# Patient Record
Sex: Female | Born: 1935 | Race: White | Hispanic: No | Marital: Single | Smoking: Current every day smoker
Health system: Southern US, Community
[De-identification: ages and names within clinical notes are randomized; demographics above are authoritative.]

## PROBLEM LIST (undated history)

## (undated) DIAGNOSIS — Z789 Other specified health status: Secondary | ICD-10-CM

## (undated) HISTORY — PX: FACIAL FRACTURE SURGERY: SHX1570

## (undated) HISTORY — PX: PARTIAL HYSTERECTOMY: SHX80

---

## 2010-10-23 ENCOUNTER — Ambulatory Visit: Payer: Self-pay | Admitting: Family Medicine

## 2013-02-11 ENCOUNTER — Ambulatory Visit: Payer: Self-pay | Admitting: Physician Assistant

## 2013-02-16 ENCOUNTER — Ambulatory Visit: Payer: Self-pay | Admitting: Physician Assistant

## 2014-05-12 IMAGING — US ULTRASOUND LEFT BREAST
1 series · 13 of 13 positions shown · non-contrast
Comparison: none

REASON FOR EXAM: av lt parenchymal density
COMMENTS:

PROCEDURE:     US  - US BREAST LEFT  - February 16, 2013 [DATE]
RESULT:     Ultrasound of the retroareolar portion left breast reveals no
focal abnormality. Compression spot studies reveal no focal abnormality.

[Series 1: ultrasound left breast · 0.08mm/px · 13 of 13 slices shown]
[im 1/13]
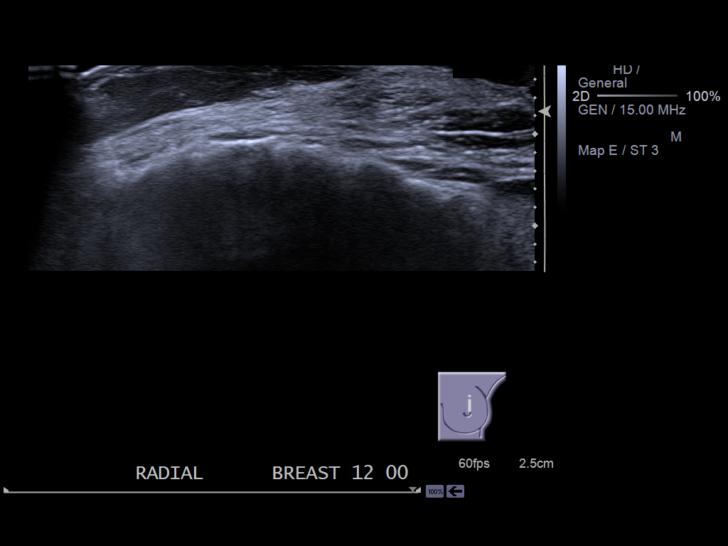
[im 2/13]
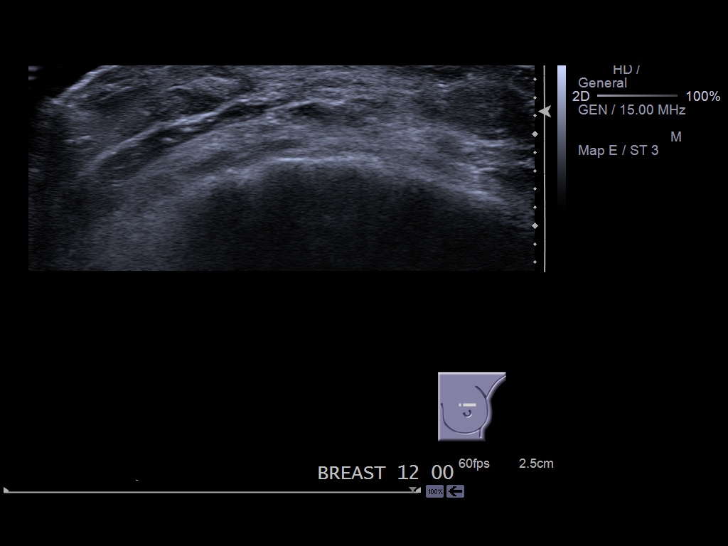
[im 3/13]
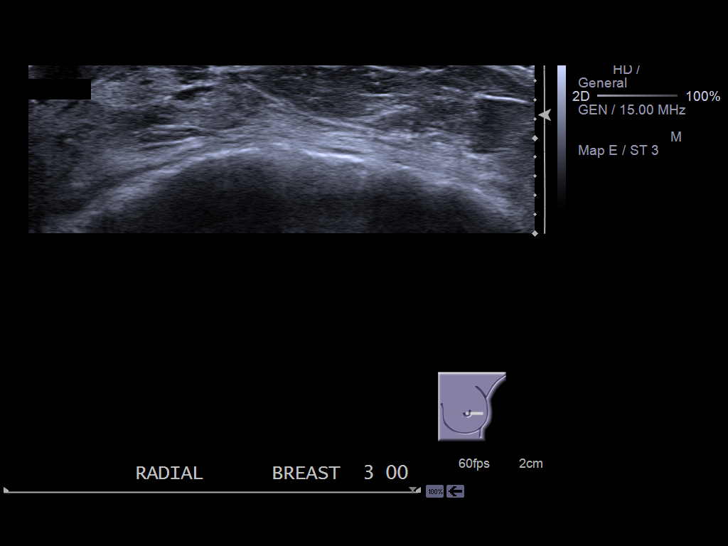
[im 4/13]
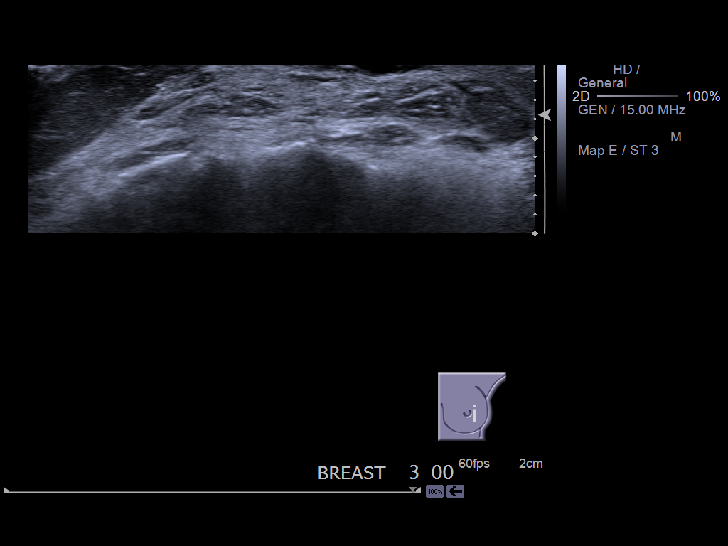
[im 5/13]
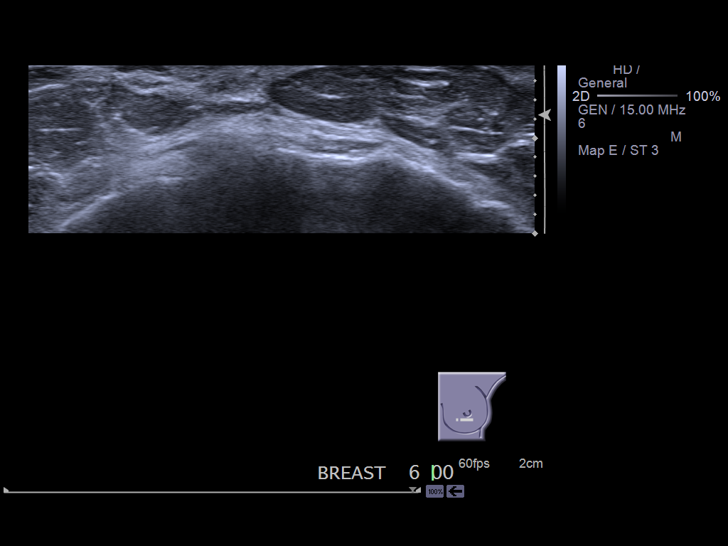
[im 6/13]
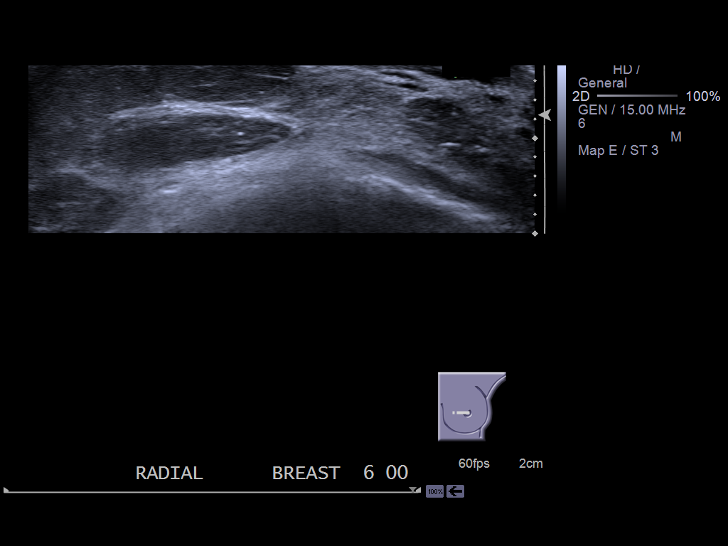
[im 7/13]
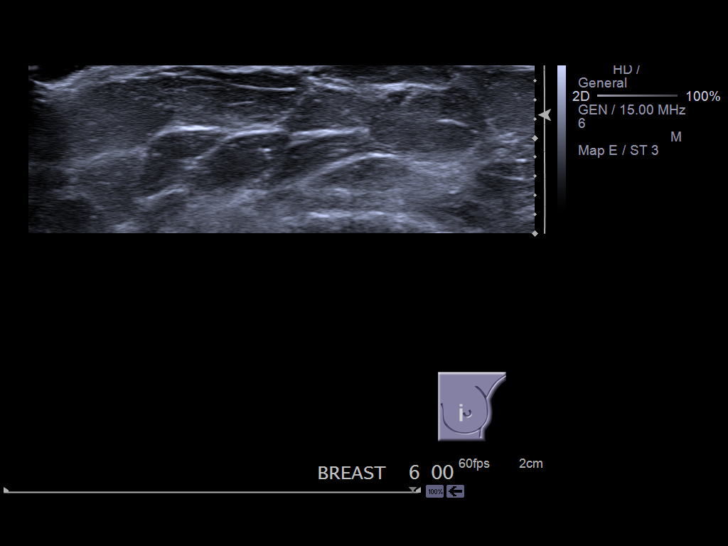
[im 8/13]
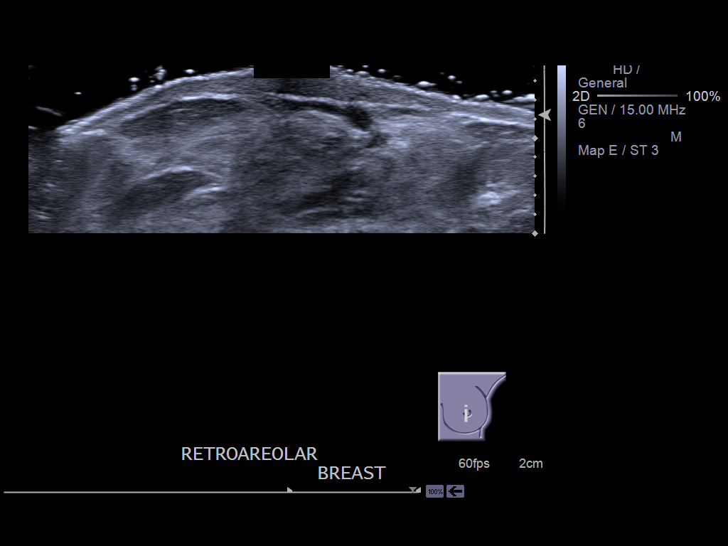
[im 9/13]
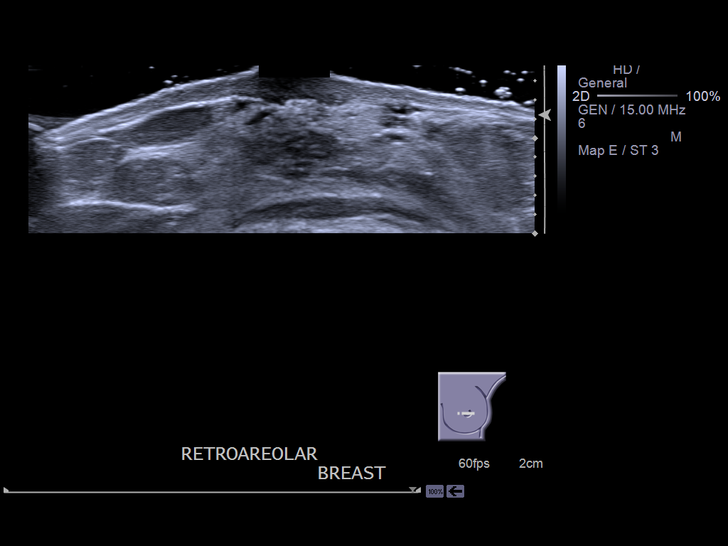
[im 10/13]
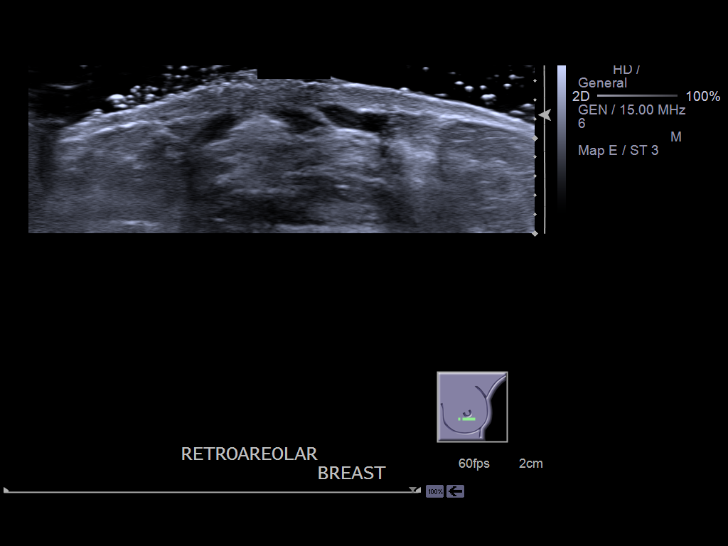
[im 11/13]
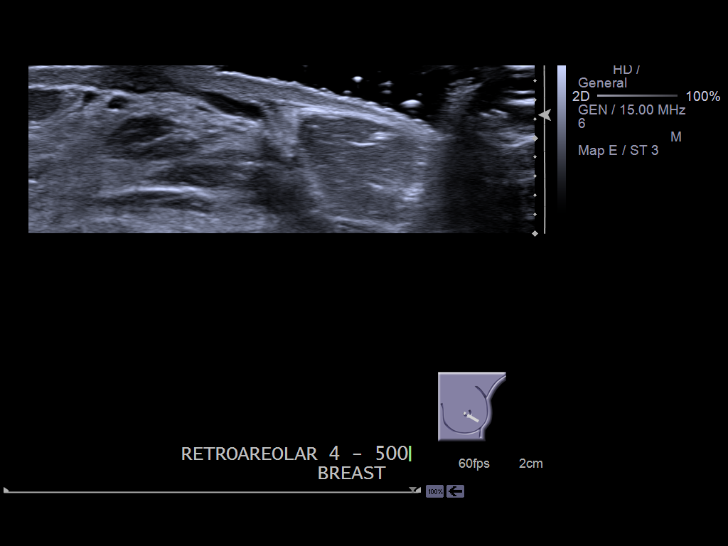
[im 12/13]
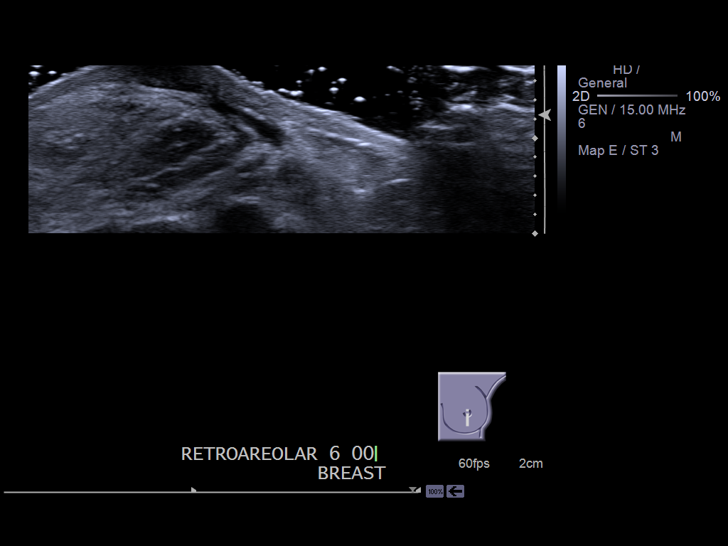
[im 13/13]
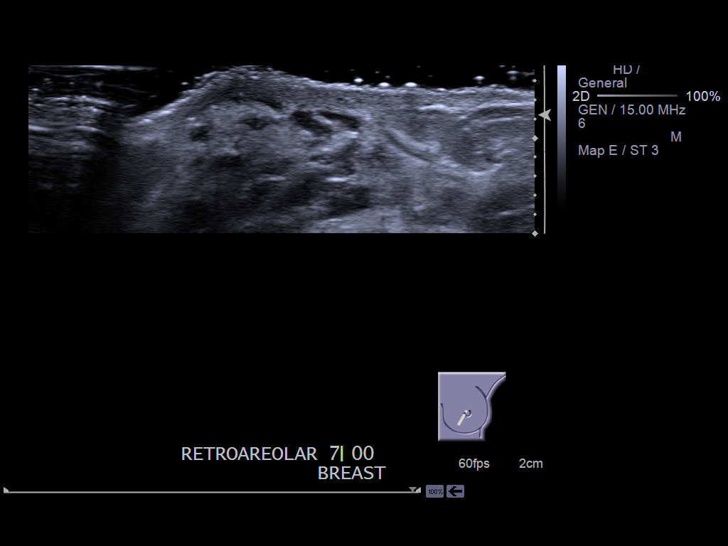

[13 of 13 positions shown; findings below may reference images not displayed]

IMPRESSION: Benign exam. BI-RADS: Category 2- Benign Finding

A NEGATIVE MAMMOGRAM REPORT DOES NOT PRECLUDE BIOPSY OR OTHER EVALUATION OF
A CLINICALLY PALPABLE OR OTHERWISE SUSPICIOUS MASS OR LESION. BREAST CANCER
MAY NOT BE DETECTED IN UP TO 10% OF CASES.

## 2015-03-23 ENCOUNTER — Ambulatory Visit
Admission: RE | Admit: 2015-03-23 | Discharge: 2015-03-23 | Disposition: A | Discharge status: Home / Self Care | Payer: Medicare Other | Source: Ambulatory Visit | Attending: Physician Assistant | Admitting: Physician Assistant

## 2015-03-23 ENCOUNTER — Other Ambulatory Visit: Payer: Self-pay | Admitting: Physician Assistant

## 2015-03-23 DIAGNOSIS — R2241 Localized swelling, mass and lump, right lower limb: Secondary | ICD-10-CM | POA: Diagnosis not present

## 2015-03-23 DIAGNOSIS — M7989 Other specified soft tissue disorders: Secondary | ICD-10-CM | POA: Insufficient documentation

## 2015-03-23 DIAGNOSIS — R609 Edema, unspecified: Secondary | ICD-10-CM

## 2017-02-04 ENCOUNTER — Encounter: Payer: Self-pay | Admitting: Emergency Medicine

## 2017-02-04 ENCOUNTER — Emergency Department
Admission: EM | Admit: 2017-02-04 | Discharge: 2017-02-04 | Disposition: A | Discharge status: Home / Self Care | Payer: Medicare Other | Attending: Emergency Medicine | Admitting: Emergency Medicine

## 2017-02-04 DIAGNOSIS — Z2914 Encounter for prophylactic rabies immune globin: Secondary | ICD-10-CM | POA: Diagnosis not present

## 2017-02-04 DIAGNOSIS — Z23 Encounter for immunization: Secondary | ICD-10-CM

## 2017-02-04 DIAGNOSIS — W5501XA Bitten by cat, initial encounter: Secondary | ICD-10-CM | POA: Insufficient documentation

## 2017-02-04 DIAGNOSIS — F17200 Nicotine dependence, unspecified, uncomplicated: Secondary | ICD-10-CM | POA: Diagnosis not present

## 2017-02-04 HISTORY — DX: Other specified health status: Z78.9

## 2017-02-04 MED ORDER — RABIES VACCINE, PCEC IM SUSR
1.0000 mL | Freq: Once | INTRAMUSCULAR | Status: AC
  Administered 2017-02-04: 1 mL via INTRAMUSCULAR
  Filled 2017-02-04: qty 1

## 2017-02-04 MED ORDER — RABIES IMMUNE GLOBULIN 150 UNIT/ML IM INJ
20.0000 [IU]/kg | INJECTION | Freq: Once | INTRAMUSCULAR | Status: AC
  Administered 2017-02-04: 1125 [IU] via INTRAMUSCULAR
  Filled 2017-02-04: qty 8

## 2017-02-04 NOTE — ED Notes (Signed)
Pt presents to ED, reports getting bit by stray cat on Sunday, reported to animal control and the cat was caught and taken to the pound, animal control however lost the cat. Pt presents with 3 healing wounds to outer L leg, noted to be somewhat red. Pt denies any pain, states she does not want any shots she does not need.

## 2017-02-04 NOTE — ED Provider Notes (Signed)
[facility] Emergency Department Provider Note   ____________________________________________   I have reviewed the triage vital signs and the nursing notes.   HISTORY  Chief Complaint Animal Bite    HPI Haley Bridges is a 81 y.o. female presents to the emergency department after being bitten by a stray cat 3 days ago. Patient reports animal control attempted to catch the cat however, they were not successful. Animal control recommended she come to the emergency department for rabies vaccination. Patient has cat bite wound along the left lateral leg, 3 puncture wounds with scabbing and erythema that appears to be healing. Patient reports she normally performs self treatment on past cat bite wounds. Patient asked questions about whether she should have the rabies vaccination regimen. I advised her since animal control could not obtain the status of the cat my best recommendation is that she follow through with the rabies vaccination course. Patient denies fever, chills, headache, vision changes, chest pain, chest tightness, shortness of breath, abdominal pain, nausea and vomiting.  Past Medical History:  Diagnosis Date  . Patient denies medical problems     There are no active problems to display for this patient.   Past Surgical History:  Procedure Laterality Date  . FACIAL FRACTURE SURGERY    . PARTIAL HYSTERECTOMY      Prior to Admission medications   Not on File    Allergies Other  History reviewed. No pertinent family history.  Social History Social History  Substance Use Topics  . Smoking status: Current Every Day Smoker  . Smokeless tobacco: Never Used  . Alcohol use No    Review of Systems Constitutional: Negative for fever/chills Eyes: No visual changes. ENT:  Negative for sore throat and for difficulty swallowing Cardiovascular: Denies chest pain. Respiratory: Denies cough. Denies shortness of breath. Gastrointestinal: No  abdominal pain.  No nausea, vomiting, diarrhea. Musculoskeletal: Negative for back pain. Skin: Negative for rash. Along the left lateral lower leg cat bite wound with with 3 puncture wounds.  Neurological: Negative for headaches.   ____________________________________________   PHYSICAL EXAM:  VITAL SIGNS: ED Triage Vitals [02/04/17 1334]  Enc Vitals Group     BP (!) 141/81     Pulse Rate (!) 105     Resp 20     Temp 98.4 F (36.9 C)     Temp Source Oral     SpO2 95 %     Weight 125 lb (56.7 kg)     Height 5\' 3"  (1.6 m)     Head Circumference      Peak Flow      Pain Score      Pain Loc      Pain Edu?      Excl. in GC?     Constitutional: Alert and oriented. Well appearing and in no acute distress.  Head: Normocephalic and atraumatic. Eyes: Conjunctivae are normal. PERRL.  Cardiovascular: Normal rate, regular rhythm. Normal distal pulses. Respiratory: Normal respiratory effort.  Gastrointestinal: Soft and nontender. No distention. Musculoskeletal: Nontender with normal range of motion in all extremities. Neurologic: Normal speech and language.  Skin:  Skin is warm, dry and intact. No rash noted. Along the left lateral lower leg cat bite wound: 3 puncture wounds with scabbing and erythema along the periwound. No drainage noted  Psychiatric: Mood and affect are normal.  ____________________________________________   LABS (all labs ordered are listed, but only abnormal results are displayed)  Labs Reviewed - No data to display  ____________________________________________  EKG None ____________________________________________  RADIOLOGY None ____________________________________________   PROCEDURES  Procedure(s) performed: No    Critical Care performed: no ____________________________________________   INITIAL IMPRESSION / ASSESSMENT AND PLAN / ED COURSE  Pertinent labs & imaging results that were available during my care of the patient were reviewed  by me and considered in my medical decision making (see chart for details).  Patient presented with cat bite wound along the left lateral leg, 3 puncture wounds covered with scabs and noted erythema along the periwound. History and physical exam are reassuring the wounds were healing and the patient was keeping the wound area clean and dry. Discussed with the patient recommended prophylactic antibiotic regimen for cat bite wounds. Patient did not feel she needed to take antibiotics and that she generally took care of wounds in the past and never had a problem in the past. Reviewed the risks of not taking an antibiotic and patient verbalized understanding as well as signed a refusal form. Patient did receive the initial rabies vaccination dosages during the course of care in emergency department. Patient provided educational material regarding the vaccines themselves as well as the next 4 doses and time frame. Patient informed of clinical course, understand medical decision-making process, and agree with plan.  Patient was advised to follow up with PCP or urgent care for future rabies vaccines or return to the emergency department and was also advised to return to the emergency department for symptoms that change or worsen.     ____________________________________________   FINAL CLINICAL IMPRESSION(S) / ED DIAGNOSES  Final diagnoses:  Cat bite, initial encounter  Need for rabies vaccination       NEW MEDICATIONS STARTED DURING THIS VISIT:  There are no discharge medications for this patient.    Note:  This document was prepared using Dragon voice recognition software and may include unintentional dictation errors.    Clois Comber, PA-C 02/04/17 1934    Rockne Menghini, MD 02/09/17 [phone]

## 2017-02-04 NOTE — Discharge Instructions (Signed)
Return to the emergency department or local urgent care for rabies vaccine on 02/07/2017, 02/11/2017, 02/18/2017. Continue to monitor Bite wound areas if you notice signs of infection please return to the emergency department.

## 2017-02-04 NOTE — ED Triage Notes (Signed)
Pt was bit by stray cat Sunday.  Animal control had cat per pt and lost it so they called and told her to come here. Got bit on left leg per pt. Denies pain to site.

## 2017-02-07 ENCOUNTER — Emergency Department
Admission: EM | Admit: 2017-02-07 | Discharge: 2017-02-07 | Disposition: A | Discharge status: Home / Self Care | Payer: Medicare Other | Attending: Emergency Medicine | Admitting: Emergency Medicine

## 2017-02-07 ENCOUNTER — Encounter: Payer: Self-pay | Admitting: Emergency Medicine

## 2017-02-07 DIAGNOSIS — F17200 Nicotine dependence, unspecified, uncomplicated: Secondary | ICD-10-CM | POA: Insufficient documentation

## 2017-02-07 DIAGNOSIS — Z23 Encounter for immunization: Secondary | ICD-10-CM | POA: Insufficient documentation

## 2017-02-07 DIAGNOSIS — Z203 Contact with and (suspected) exposure to rabies: Secondary | ICD-10-CM | POA: Diagnosis present

## 2017-02-07 MED ORDER — RABIES VACCINE, PCEC IM SUSR
1.0000 mL | Freq: Once | INTRAMUSCULAR | Status: AC
  Administered 2017-02-07: 1 mL via INTRAMUSCULAR
  Filled 2017-02-07: qty 1

## 2017-02-07 NOTE — ED Provider Notes (Signed)
[facility] Emergency Department Provider Note   ____________________________________________   First MD Initiated Contact with Patient 02/07/17 1424     (approximate)  I have reviewed the triage vital signs and the nursing notes.   HISTORY  Chief Complaint Rabies Injection    HPI Haley Bridges is a 81 y.o. female patient is here for second rabies series. Patient was bitten by a cat earlier this week. Animal control was unsuccessful entrapment. Patient states no reaction from taking injections of medications.   Past Medical History:  Diagnosis Date  . Patient denies medical problems     There are no active problems to display for this patient.   Past Surgical History:  Procedure Laterality Date  . FACIAL FRACTURE SURGERY    . PARTIAL HYSTERECTOMY      Prior to Admission medications   Not on File    Allergies Other  No family history on file.  Social History Social History  Substance Use Topics  . Smoking status: Current Every Day Smoker  . Smokeless tobacco: Never Used  . Alcohol use No    Review of Systems Constitutional: No fever/chills Eyes: No visual changes. ENT: No sore throat. Cardiovascular: Denies chest pain. Respiratory: Denies shortness of breath. Gastrointestinal: No abdominal pain.  No nausea, no vomiting.  No diarrhea.  No constipation. Genitourinary: Negative for dysuria. Musculoskeletal: Negative for back pain. Skin: Negative for rash. Neurological: Negative for headaches, focal weakness or numbness.   ____________________________________________   PHYSICAL EXAM:  VITAL SIGNS: ED Triage Vitals  Enc Vitals Group     BP 02/07/17 1417 (!) 141/100     Pulse Rate 02/07/17 1417 75     Resp 02/07/17 1417 18     Temp 02/07/17 1417 98.7 F (37.1 C)     Temp Source 02/07/17 1417 Oral     SpO2 02/07/17 1417 98 %     Weight 02/07/17 1417 125 lb (56.7 kg)     Height 02/07/17 1417 5\' 3"  (1.6 m)   Head Circumference --      Peak Flow --      Pain Score 02/07/17 1416 0     Pain Loc --      Pain Edu? --      Excl. in GC? --     Constitutional: Alert and oriented. Well appearing and in no acute distress. Cardiovascular: Normal rate, regular rhythm. Grossly normal heart sounds.  Good peripheral circulation. Respiratory: Normal respiratory effort.  No retractions. Lungs CTAB. Musculoskeletal: No lower extremity tenderness nor edema.  No joint effusions. Neurologic:  Normal speech and language. No gross focal neurologic deficits are appreciated. No gait instability. Skin:  Skin is warm, dry and intact. No rash noted.Healing puncture wounds left lateral leg Psychiatric: Mood and affect are normal. Speech and behavior are normal.  ____________________________________________   LABS (all labs ordered are listed, but only abnormal results are displayed)  Labs Reviewed - No data to display ____________________________________________  EKG   ____________________________________________  RADIOLOGY  No results found.  ____________________________________________   PROCEDURES  Procedure(s) performed: None  Procedures  Critical Care performed: No  ____________________________________________   INITIAL IMPRESSION / ASSESSMENT AND PLAN / ED COURSE  Pertinent labs & imaging results that were available during my care of the patient were reviewed by me and considered in my medical decision making (see chart for details).  Second rabies series secondary to cat bite to the left lower leg. Patient given discharge Instructions after receiving injections. Patient  advised to follow up per scheduled to complete series.      ____________________________________________   FINAL CLINICAL IMPRESSION(S) / ED DIAGNOSES  Final diagnoses:  Rabies exposure      NEW MEDICATIONS STARTED DURING THIS VISIT:  New Prescriptions   No medications on file     Note:  This document  was prepared using Dragon voice recognition software and may include unintentional dictation errors.    Joni ReiningSmith, Ronald K, PA-C 02/07/17 1432    Governor RooksLord, Rebecca, MD 02/07/17 [phone]

## 2017-02-07 NOTE — ED Triage Notes (Signed)
Here for 2nd in rabies series.

## 2017-02-11 ENCOUNTER — Encounter: Payer: Self-pay | Admitting: Emergency Medicine

## 2017-02-11 ENCOUNTER — Emergency Department
Admission: EM | Admit: 2017-02-11 | Discharge: 2017-02-11 | Disposition: A | Discharge status: Home / Self Care | Payer: Medicare Other | Attending: Student in an Organized Health Care Education/Training Program | Admitting: Student in an Organized Health Care Education/Training Program

## 2017-02-11 DIAGNOSIS — Z2914 Encounter for prophylactic rabies immune globin: Secondary | ICD-10-CM | POA: Diagnosis not present

## 2017-02-11 DIAGNOSIS — Z23 Encounter for immunization: Secondary | ICD-10-CM | POA: Insufficient documentation

## 2017-02-11 DIAGNOSIS — Z203 Contact with and (suspected) exposure to rabies: Secondary | ICD-10-CM | POA: Diagnosis not present

## 2017-02-11 DIAGNOSIS — F17200 Nicotine dependence, unspecified, uncomplicated: Secondary | ICD-10-CM | POA: Diagnosis not present

## 2017-02-11 MED ORDER — RABIES VACCINE, PCEC IM SUSR
1.0000 mL | Freq: Once | INTRAMUSCULAR | Status: AC
  Administered 2017-02-11: 1 mL via INTRAMUSCULAR
  Filled 2017-02-11: qty 1

## 2017-02-11 NOTE — ED Provider Notes (Signed)
[facility] Emergency Department Provider Note ____________________________________________  Time seen: 1606  I have reviewed the triage vital signs and the nursing notes.  HISTORY  Chief Complaint  Rabies Injection  HPI Haley Bridges is a 81 y.o. female presents to the ED for her 3rd of 4 rabies vaccine. She denies any interim complaints.   Past Medical History:  Diagnosis Date  . Patient denies medical problems     There are no active problems to display for this patient.   Past Surgical History:  Procedure Laterality Date  . FACIAL FRACTURE SURGERY    . PARTIAL HYSTERECTOMY      Prior to Admission medications   Not on File    Allergies Other  No family history on file.  Social History Social History  Substance Use Topics  . Smoking status: Current Every Day Smoker  . Smokeless tobacco: Never Used  . Alcohol use No    Review of Systems  Constitutional: Negative for fever. Cardiovascular: Negative for chest pain. Respiratory: Negative for shortness of breath. Gastrointestinal: Negative for abdominal pain, vomiting and diarrhea. Musculoskeletal: Negative for back pain. Skin: Negative for rash. Neurological: Negative for headaches, focal weakness or numbness. ____________________________________________  PHYSICAL EXAM:  VITAL SIGNS: ED Triage Vitals  Enc Vitals Group     BP 02/11/17 1558 (!) 137/50     Pulse Rate 02/11/17 1558 94     Resp 02/11/17 1558 16     Temp 02/11/17 1558 98.9 F (37.2 C)     Temp Source 02/11/17 1558 Oral     SpO2 02/11/17 1558 95 %     Weight 02/11/17 1557 125 lb (56.7 kg)     Height 02/11/17 1557 5\' 3"  (1.6 m)     Head Circumference --      Peak Flow --      Pain Score --      Pain Loc --      Pain Edu? --      Excl. in GC? --     Constitutional: Alert and oriented. Well appearing and in no distress. Head: Normocephalic and atraumatic. Cardiovascular: Normal rate, regular rhythm.  Normal distal pulses. Respiratory: Normal respiratory effort. No wheezes/rales/rhonchi. Musculoskeletal: Nontender with normal range of motion in all extremities.  Neurologic:  Normal gait without ataxia. Normal speech and language. No gross focal neurologic deficits are appreciated. Skin:  Skin is warm, dry and intact. No rash noted. Well-healing wounds of the left lateral calf. Three scabbed wounds with local erythema. No induration, warmth, streaking, or purulent drainage.  ____________________________________________  PROCEDURES  Rabies Vaccine 1 ml IM ____________________________________________  INITIAL IMPRESSION / ASSESSMENT AND PLAN / ED COURSE  Patient with ED administration of her 3rd of 4 rabies vaccines. She should return in 1 week for her 4th and final vaccine.  ____________________________________________  FINAL CLINICAL IMPRESSION(S) / ED DIAGNOSES  Final diagnoses:  Encounter for repeat administration of rabies vaccination      Lissa HoardMenshew, Betzabe Bevans V Bacon, PA-C 02/11/17 1627    Willy Eddyobinson, Patrick, MD 02/11/17 [phone]

## 2017-02-11 NOTE — ED Triage Notes (Signed)
Here for 3rd Rabies injection

## 2017-02-11 NOTE — Discharge Instructions (Signed)
Return to the ED next Wednesday, August 1st for your 4th and final rabies vaccine.

## 2017-02-18 ENCOUNTER — Emergency Department
Admission: EM | Admit: 2017-02-18 | Discharge: 2017-02-18 | Disposition: A | Discharge status: Home / Self Care | Payer: Medicare Other | Attending: Emergency Medicine | Admitting: Emergency Medicine

## 2017-02-18 ENCOUNTER — Encounter: Payer: Self-pay | Admitting: Emergency Medicine

## 2017-02-18 DIAGNOSIS — F17200 Nicotine dependence, unspecified, uncomplicated: Secondary | ICD-10-CM | POA: Insufficient documentation

## 2017-02-18 DIAGNOSIS — Z203 Contact with and (suspected) exposure to rabies: Secondary | ICD-10-CM | POA: Diagnosis not present

## 2017-02-18 DIAGNOSIS — Z23 Encounter for immunization: Secondary | ICD-10-CM | POA: Insufficient documentation

## 2017-02-18 MED ORDER — RABIES VACCINE, PCEC IM SUSR
1.0000 mL | Freq: Once | INTRAMUSCULAR | Status: AC
  Administered 2017-02-18: 1 mL via INTRAMUSCULAR
  Filled 2017-02-18: qty 1

## 2017-02-18 NOTE — ED Triage Notes (Signed)
Here for rabies injection.  

## 2017-02-19 NOTE — ED Provider Notes (Signed)
[facility] Emergency Department Provider Note  ____________________________________________  Time seen: Approximately 7:33 AM  I have reviewed the triage vital signs and the nursing notes.   HISTORY  Chief Complaint Rabies Injection    HPI Haley Bridges is a 81 y.o. female that presents to the emergency department for the 4th of 4 rabies vaccines. No interim complaints.   Past Medical History:  Diagnosis Date  . Patient denies medical problems     There are no active problems to display for this patient.   Past Surgical History:  Procedure Laterality Date  . FACIAL FRACTURE SURGERY    . PARTIAL HYSTERECTOMY      Prior to Admission medications   Not on File    Allergies Other  No family history on file.  Social History Social History  Substance Use Topics  . Smoking status: Current Every Day Smoker  . Smokeless tobacco: Never Used  . Alcohol use No     Review of Systems  Cardiovascular: No chest pain. Respiratory: No SOB. Gastrointestinal: No abdominal pain.  No nausea, no vomiting.  Musculoskeletal: Negative for musculoskeletal pain. Skin: Negative for rash, abrasions, lacerations, ecchymosis.   ____________________________________________   PHYSICAL EXAM:  VITAL SIGNS: ED Triage Vitals  Enc Vitals Group     BP 02/18/17 1456 (!) 155/97     Pulse Rate 02/18/17 1456 84     Resp 02/18/17 1456 16     Temp 02/18/17 1456 98.6 F (37 C)     Temp Source 02/18/17 1456 Oral     SpO2 02/18/17 1456 98 %     Weight 02/18/17 1457 125 lb (56.7 kg)     Height 02/18/17 1457 5\' 3"  (1.6 m)     Head Circumference --      Peak Flow --      Pain Score --      Pain Loc --      Pain Edu? --      Excl. in GC? --      Constitutional: Alert and oriented. Well appearing and in no acute distress. Eyes: Conjunctivae are normal. PERRL. EOMI. Head: Atraumatic. ENT:      Ears:      Nose: No congestion/rhinnorhea.  Mouth/Throat: Mucous membranes are moist.  Neck: No stridor.  Cardiovascular: Normal rate, regular rhythm.  Good peripheral circulation. Respiratory: Normal respiratory effort without tachypnea or retractions. Lungs CTAB. Good air entry to the bases with no decreased or absent breath sounds. Musculoskeletal: Full range of motion to all extremities. No gross deformities appreciated. Neurologic:  Normal speech and language. No gross focal neurologic deficits are appreciated.  Skin:  Skin is warm, dry. Well healing wounds to lateral left calf. No induration, warmth, streaking, purulent drainage.   ____________________________________________   LABS (all labs ordered are listed, but only abnormal results are displayed)  Labs Reviewed - No data to display ____________________________________________  EKG   ____________________________________________  RADIOLOGY  No results found.  ____________________________________________    PROCEDURES  Procedure(s) performed:    Procedures    Medications  rabies vaccine (RABAVERT) injection 1 mL (1 mL Intramuscular Given 02/18/17 1534)     ____________________________________________   INITIAL IMPRESSION / ASSESSMENT AND PLAN / ED COURSE  Pertinent labs & imaging results that were available during my care of the patient were reviewed by me and considered in my medical decision making (see chart for details).  Review of the [place] CSRS was performed in accordance of the [facility] prior to dispensing  any controlled drugs.   Presented to the ED for administration of her fourth of 4 rabies vaccines. Patient is given ED precautions to return to the ED for any worsening or new symptoms.     ____________________________________________  FINAL CLINICAL IMPRESSION(S) / ED DIAGNOSES  Final diagnoses:  Encounter for repeat administration of rabies vaccination      NEW MEDICATIONS STARTED DURING THIS VISIT:  There are no discharge  medications for this patient.       This chart was dictated using voice recognition software/Dragon. Despite best efforts to proofread, errors can occur which can change the meaning. Any change was purely unintentional.    Enid DerryWagner, Caitrin Pendergraph, PA-C 02/19/17 1530    Arnaldo NatalMalinda, Paul F, MD 02/19/17 [phone]

## 2017-05-12 ENCOUNTER — Encounter: Payer: Self-pay | Admitting: Emergency Medicine

## 2017-05-12 ENCOUNTER — Emergency Department: Payer: Medicare Other

## 2017-05-12 ENCOUNTER — Emergency Department
Admission: EM | Admit: 2017-05-12 | Discharge: 2017-05-12 | Disposition: A | Discharge status: Home / Self Care | Payer: Medicare Other | Attending: Emergency Medicine | Admitting: Emergency Medicine

## 2017-05-12 DIAGNOSIS — R1032 Left lower quadrant pain: Secondary | ICD-10-CM | POA: Diagnosis present

## 2017-05-12 DIAGNOSIS — F17210 Nicotine dependence, cigarettes, uncomplicated: Secondary | ICD-10-CM | POA: Insufficient documentation

## 2017-05-12 DIAGNOSIS — K5792 Diverticulitis of intestine, part unspecified, without perforation or abscess without bleeding: Secondary | ICD-10-CM | POA: Diagnosis not present

## 2017-05-12 LAB — COMPREHENSIVE METABOLIC PANEL
ALT: 14 U/L (ref 14–54)
AST: 24 U/L (ref 15–41)
Albumin: 3.9 g/dL (ref 3.5–5.0)
Alkaline Phosphatase: 95 U/L (ref 38–126)
Anion gap: 12 (ref 5–15)
BUN: 11 mg/dL (ref 6–20)
CHLORIDE: 101 mmol/L (ref 101–111)
CO2: 25 mmol/L (ref 22–32)
CREATININE: 0.95 mg/dL (ref 0.44–1.00)
Calcium: 9.4 mg/dL (ref 8.9–10.3)
GFR, EST NON AFRICAN AMERICAN: 55 mL/min — AB (ref >60)
Glucose, Bld: 106 mg/dL — ABNORMAL HIGH (ref 65–99)
Potassium: 3.7 mmol/L (ref 3.5–5.1)
Sodium: 138 mmol/L (ref 135–145)
Total Bilirubin: 1 mg/dL (ref 0.3–1.2)
Total Protein: 7.3 g/dL (ref 6.5–8.1)

## 2017-05-12 LAB — CBC
HCT: 41.7 % (ref 35.0–47.0)
Hemoglobin: 14 g/dL (ref 12.0–16.0)
MCH: 29.4 pg (ref 26.0–34.0)
MCHC: 33.5 g/dL (ref 32.0–36.0)
MCV: 87.7 fL (ref 80.0–100.0)
PLATELETS: 311 10*3/uL (ref 150–440)
RBC: 4.76 MIL/uL (ref 3.80–5.20)
RDW: 14.1 % (ref 11.5–14.5)
WBC: 12.6 10*3/uL — ABNORMAL HIGH (ref 3.6–11.0)

## 2017-05-12 LAB — URINALYSIS, COMPLETE (UACMP) WITH MICROSCOPIC
BILIRUBIN URINE: NEGATIVE
Bacteria, UA: NONE SEEN
Glucose, UA: NEGATIVE mg/dL
KETONES UR: 5 mg/dL — AB
Nitrite: NEGATIVE
Protein, ur: 30 mg/dL — AB
Specific Gravity, Urine: 1.021 (ref 1.005–1.030)
pH: 5 (ref 5.0–8.0)

## 2017-05-12 LAB — DIFFERENTIAL
BASOS ABS: 0.1 10*3/uL (ref 0–0.1)
BASOS PCT: 1 %
EOS ABS: 0 10*3/uL (ref 0–0.7)
Eosinophils Relative: 0 %
LYMPHS ABS: 1.7 10*3/uL (ref 1.0–3.6)
Lymphocytes Relative: 14 %
Monocytes Absolute: 0.7 10*3/uL (ref 0.2–0.9)
Monocytes Relative: 6 %
NEUTROS PCT: 79 %
Neutro Abs: 9.8 10*3/uL — ABNORMAL HIGH (ref 1.4–6.5)

## 2017-05-12 LAB — LIPASE, BLOOD: Lipase: 23 U/L (ref 11–51)

## 2017-05-12 MED ORDER — AMOXICILLIN-POT CLAVULANATE 875-125 MG PO TABS: 1.0000 | ORAL_TABLET | Freq: Three times a day (TID) | ORAL | 0 refills | Status: AC

## 2017-05-12 MED ORDER — HYDROCODONE-ACETAMINOPHEN 5-325 MG PO TABS: 1.0000 | ORAL_TABLET | Freq: Four times a day (QID) | ORAL | 0 refills | Status: DC | PRN

## 2017-05-12 MED ORDER — IOPAMIDOL (ISOVUE-300) INJECTION 61%
30.0000 mL | Freq: Once | INTRAVENOUS | Status: AC | PRN
  Administered 2017-05-12: 30 mL via ORAL

## 2017-05-12 MED ORDER — ONDANSETRON 4 MG PO TBDP: ORAL_TABLET | ORAL | 1 refills | Status: DC

## 2017-05-12 MED ORDER — IOPAMIDOL (ISOVUE-300) INJECTION 61%
75.0000 mL | Freq: Once | INTRAVENOUS | Status: AC | PRN
  Administered 2017-05-12: 75 mL via INTRAVENOUS

## 2017-05-12 MED ORDER — DOCUSATE SODIUM 100 MG PO CAPS: ORAL_CAPSULE | ORAL | 0 refills | Status: AC

## 2017-05-12 NOTE — Discharge Instructions (Signed)
We believe your symptoms are caused by diverticulitis.  Most of the time this condition (please read through the included information) can be cured with outpatient antibiotics.  Please take the full course of prescribed medication(s) and follow up with the doctors recommended above.  Return to the ED if your abdominal pain worsens or fails to improve, you develop bloody vomiting, bloody diarrhea, you are unable to tolerate fluids due to vomiting, fever greater than 101, or other symptoms that concern you.  Take Percocet as prescribed for severe pain. Do not drink alcohol, drive or participate in any other potentially dangerous activities while taking this medication as it may make you sleepy. Do not take this medication with any other sedating medications, either prescription or over-the-counter. If you were prescribed Percocet or Vicodin, do not take these with acetaminophen (Tylenol) as it is already contained within these medications.   This medication is an opiate (or narcotic) pain medication and can be habit forming.  Use it as little as possible to achieve adequate pain control.  Do not use or use it with extreme caution if you have a history of opiate abuse or dependence.  If you are on a pain contract with your primary care doctor or a pain specialist, be sure to let them know you were prescribed this medication today from the [facility].  This medication is intended for your use only - do not give any to anyone else and keep it in a secure place where nobody else, especially children, have access to it.  It will also cause or worsen constipation, so you may want to consider taking an over-the-counter stool softener while you are taking this medication.  

## 2017-05-12 NOTE — ED Provider Notes (Signed)
[facility] Emergency Department Provider Note  ____________________________________________   First MD Initiated Contact with Patient 05/12/17 1327     (approximate)  I have reviewed the triage vital signs and the nursing notes.   HISTORY  Chief Complaint Rectal Bleeding    HPI Haley Bridges is a 81 y.o. female Who is generally quite healthy for her age and does not take any regular medications.  she presents for evaluation of left lower quadrant abdominal pain and seeing some bright red blood in her stools for the last several days.  She was sent over from the urgent care for additional evaluation.  She reports that intermittently she has had mild to moderate sharp and stabbing pain in the left lower quadrant of her abdomen over the last 3-5 days.  Nothing in particular makes it better nor worse.  She noticed over about the same period of time that she has had some bright red blood in her stool.  She has never had an episode of our emergency resulted in only passing blood.  She does not describe it as dark or tarry.  She has had no generalized weakness nor fatigue.  She denies fever/chills, chest pain, shortness of breath, nausea, vomiting, and diarrhea.  He has not had similar symptoms in the past.   Past Medical History:  Diagnosis Date  . Patient denies medical problems     There are no active problems to display for this patient.   Past Surgical History:  Procedure Laterality Date  . FACIAL FRACTURE SURGERY    . PARTIAL HYSTERECTOMY      Prior to Admission medications   Medication Sig Start Date End Date Taking? Authorizing Provider  amoxicillin-clavulanate (AUGMENTIN) 875-125 MG tablet Take 1 tablet by mouth every 8 (eight) hours. 05/12/17 05/22/17  Loleta Rose, MD  docusate sodium (COLACE) 100 MG capsule Take 1 tablet once or twice daily as needed for constipation while taking narcotic pain medicine 05/12/17   Loleta Rose, MD    HYDROcodone-acetaminophen (NORCO/VICODIN) 5-325 MG tablet Take 1 tablet by mouth every 6 (six) hours as needed for severe pain. 05/12/17   Loleta Rose, MD  ondansetron (ZOFRAN ODT) 4 MG disintegrating tablet Allow 1-2 tablets to dissolve in your mouth every 8 hours as needed for nausea/vomiting 05/12/17   Loleta Rose, MD    Allergies Patient has no known allergies.  No family history on file.  Social History Social History  Substance Use Topics  . Smoking status: Current Every Day Smoker    Packs/day: 1.00    Types: Cigarettes  . Smokeless tobacco: Never Used  . Alcohol use No    Review of Systems Constitutional: No fever/chills. no generalized weakness, no fatigue Eyes: No visual changes. ENT: No sore throat. Cardiovascular: Denies chest pain. Respiratory: Denies shortness of breath. Gastrointestinal: intermittent sharp stabbing left lower quadrant abdominal pain over the last 3-5 days with some bright red blood mixed with her stool.  Denies nausea and vomiting. Genitourinary: Negative for dysuria. Musculoskeletal: Negative for neck pain.  Negative for back pain. Integumentary: Negative for rash. Neurological: Negative for headaches, focal weakness or numbness.   ____________________________________________   PHYSICAL EXAM:  VITAL SIGNS: ED Triage Vitals [05/12/17 1212]  Enc Vitals Group     BP (!) 153/62     Pulse Rate 97     Resp 17     Temp 98.4 F (36.9 C)     Temp Source Oral     SpO2 97 %  Weight 56.7 kg (125 lb)     Height 1.6 m (5\' 3" )     Head Circumference      Peak Flow      Pain Score 3     Pain Loc      Pain Edu?      Excl. in [place]?     Constitutional: Alert and oriented. Well appearing and in no acute distress. Eyes: Conjunctivae are normal.  Head: Atraumatic. Nose: No congestion/rhinnorhea. Mouth/Throat: Mucous membranes are moist. Neck: No stridor.  No meningeal signs.   Cardiovascular: Normal rate, regular rhythm. Good peripheral  circulation. Grossly normal heart sounds. Respiratory: Normal respiratory effort.  No retractions. Lungs CTAB. Gastrointestinal: Soft and nontender. No distention.  Genitourinary/Rectal:  very minimal stool was present in the rectal vault on digital exam, only a small amount of light brown stool.  Hemoccult was positive.  Quality control passed.  ED chaperone present throughout exam.   Musculoskeletal: No lower extremity tenderness nor edema. No gross deformities of extremities. Neurologic:  Normal speech and language. No gross focal neurologic deficits are appreciated.  Skin:  Skin is warm, dry and intact. No rash noted. Psychiatric: Mood and affect are normal. Speech and behavior are normal.  ____________________________________________   LABS (all labs ordered are listed, but only abnormal results are displayed)  Labs Reviewed  COMPREHENSIVE METABOLIC PANEL - Abnormal; Notable for the following:       Result Value   Glucose, Bld 106 (*)    GFR calc non Af Amer 55 (*)    All other components within normal limits  CBC - Abnormal; Notable for the following:    WBC 12.6 (*)    All other components within normal limits  URINALYSIS, COMPLETE (UACMP) WITH MICROSCOPIC - Abnormal; Notable for the following:    Color, Urine AMBER (*)    APPearance HAZY (*)    Hgb urine dipstick MODERATE (*)    Ketones, ur 5 (*)    Protein, ur 30 (*)    Leukocytes, UA SMALL (*)    Squamous Epithelial / LPF 0-5 (*)    All other components within normal limits  DIFFERENTIAL - Abnormal; Notable for the following:    Neutro Abs 9.8 (*)    All other components within normal limits  LIPASE, BLOOD   ____________________________________________  EKG  None - EKG not ordered by ED physician ____________________________________________  RADIOLOGY   Ct Abdomen Pelvis W Contrast  Result Date: 05/12/2017 CLINICAL DATA:  Abdominal pain.  Diverticulitis suspected. EXAM: CT ABDOMEN AND PELVIS WITH CONTRAST  TECHNIQUE: Multidetector CT imaging of the abdomen and pelvis was performed using the standard protocol following bolus administration of intravenous contrast. CONTRAST:  75mL ISOVUE-300 IOPAMIDOL (ISOVUE-300) INJECTION 61% COMPARISON:  None FINDINGS: Lower chest: No pleural effusion.  No acute abnormality. Hepatobiliary: Scattered liver cysts identified. The gallbladder appears normal. No biliary dilatation. Pancreas: There are few calcifications identified within the head of pancreas. No pancreatic inflammation. No pancreatic duct dilatation or mass. Spleen: The spleen appears normal. Adrenals/Urinary Tract: The adrenal glands are unremarkable. No kidney mass or hydronephrosis identified. 5 mm hypodense structure in the right kidney is too small to characterize. Urinary bladder is normal. Stomach/Bowel: Small hiatal hernia. The small bowel loops have a normal course and caliber. There is no bowel obstruction. Normal appearance of the proximal colon. Numerous distal colonic diverticula identified. There is a segment of wall thickening and inflammation of the sigmoid colon measuring approximately 8 cm, image 59 of series 2. Findings  compatible with acute diverticulitis. No abscess or evidence of free perforation. No free fluid or identified. Vascular/Lymphatic: Aortic atherosclerosis. No aneurysm. No enlarged upper abdominal lymph nodes. There is no pelvic or inguinal adenopathy. Reproductive: The uterus and adnexal structures are unremarkable. Other: No free fluid or fluid collections. Musculoskeletal: Degenerative disc disease is identified throughout the lower thoracic and lumbar spine. IMPRESSION: 1. Examination is positive for acute sigmoid diverticulitis. No complications identified. Specifically there is no evidence for perforation or abscess. 2.  Aortic Atherosclerosis (ICD10-I70.0). 3. Liver cysts. 4. Thoracic and lumbar degenerative disc disease. Electronically Signed   By: Signa Kellaylor  Stroud M.D.   On:  05/12/2017 15:29    ____________________________________________   PROCEDURES  Critical Care performed: No   Procedure(s) performed:   Procedures   ____________________________________________   INITIAL IMPRESSION / ASSESSMENT AND PLAN / ED COURSE  As part of my medical decision making, I reviewed the following data within the electronic MEDICAL RECORD NUMBER Nursing notes reviewed and incorporated, Labs reviewed  and Notes from prior ED visits    differential diagnosis includes diverticular disease, AV malformation, infectious, hemorrhoids, etc.  However based on the fact she is having left lower quadrant pain in addition to the bright red blood, I am most suspicious for diverticular disease.  She has no other signs or symptoms of infection, but her lab work does demonstrate a mild leukocytosis of 12.6.  Her metabolic panel is reassuring and her LFTs are normal.  Also reassuring is her hemoglobin of 14.0.  She does have heme positive stool but no melena or gross blood.  The patient is adamant that she does not want stay in the hospital unless it is absolutely necessary.  I will evaluate with the CT scan of her abdomen and pelvis with oral and IV contrast.  We will then determine the need for outpatient versus inpatient follow-up.  Clinical Course as of May 12 2014  Tue May 12, 2017  1605 Uncomplicated diverticulitis, no evidence of abscess/perforation.  Discussed with patient, she agrees with the plan for outpatient antibiotics and follow-up.    I gave my usual and customary return precautions.  She understands. CT Abdomen Pelvis W Contrast [CF]  1607 because the pharmacy is still open, I will discharge her to go directly to the pharmacy rather than give her a first dose in the emergency department  [CF]    Clinical Course User Index [CF] Loleta RoseForbach, Iaan Oregel, MD    ____________________________________________  FINAL CLINICAL IMPRESSION(S) / ED DIAGNOSES  Final diagnoses:    Diverticulitis     MEDICATIONS GIVEN DURING THIS VISIT:  Medications  iopamidol (ISOVUE-300) 61 % injection 30 mL (30 mLs Oral Contrast Given 05/12/17 1354)  iopamidol (ISOVUE-300) 61 % injection 75 mL (75 mLs Intravenous Contrast Given 05/12/17 1510)     NEW OUTPATIENT MEDICATIONS STARTED DURING THIS VISIT:  Discharge Medication List as of 05/12/2017  4:10 PM    START taking these medications   Details  amoxicillin-clavulanate (AUGMENTIN) 875-125 MG tablet Take 1 tablet by mouth every 8 (eight) hours., Starting Tue 05/12/2017, Until Fri 05/22/2017, Print    docusate sodium (COLACE) 100 MG capsule Take 1 tablet once or twice daily as needed for constipation while taking narcotic pain medicine, Print    HYDROcodone-acetaminophen (NORCO/VICODIN) 5-325 MG tablet Take 1 tablet by mouth every 6 (six) hours as needed for severe pain., Starting Tue 05/12/2017, Print    ondansetron (ZOFRAN ODT) 4 MG disintegrating tablet Allow 1-2 tablets to dissolve in your mouth  every 8 hours as needed for nausea/vomiting, Print        Discharge Medication List as of 05/12/2017  4:10 PM      Discharge Medication List as of 05/12/2017  4:10 PM       Note:  This document was prepared using Dragon voice recognition software and may include unintentional dictation errors.    Loleta Rose, MD 05/12/17 2015

## 2017-05-12 NOTE — ED Triage Notes (Signed)
Pt in via POV; sent over from [facility].  Pt reports noticing small amounts of bright red blood in stool w/ intermittent LLQ abdominal pain x 5 days.  Vitals WDL, NAD noted at this time.

## 2017-05-12 NOTE — ED Notes (Signed)
Pt reports with c/o blood in her stool x 3-4 days. States she was feeling "depleted" and then the blood showed up. She reports that it happens mostly when she uses the toilet, but sometimes a bit leaks out in her panties. She reports hemorrhoid hx (years ago); denies straining with bowel movements. Pt alert & oriented with NAD noted.

## 2019-12-02 ENCOUNTER — Encounter: Payer: Self-pay | Admitting: Emergency Medicine

## 2019-12-02 ENCOUNTER — Other Ambulatory Visit: Payer: Self-pay

## 2019-12-02 ENCOUNTER — Emergency Department
Admission: EM | Admit: 2019-12-02 | Discharge: 2019-12-02 | Disposition: A | Discharge status: Home / Self Care | Payer: Medicare Other | Attending: Emergency Medicine | Admitting: Emergency Medicine

## 2019-12-02 ENCOUNTER — Emergency Department: Payer: Medicare Other

## 2019-12-02 DIAGNOSIS — S7011XA Contusion of right thigh, initial encounter: Secondary | ICD-10-CM | POA: Diagnosis not present

## 2019-12-02 DIAGNOSIS — S7001XA Contusion of right hip, initial encounter: Secondary | ICD-10-CM | POA: Insufficient documentation

## 2019-12-02 DIAGNOSIS — Y92018 Other place in single-family (private) house as the place of occurrence of the external cause: Secondary | ICD-10-CM | POA: Diagnosis not present

## 2019-12-02 DIAGNOSIS — Y9301 Activity, walking, marching and hiking: Secondary | ICD-10-CM | POA: Diagnosis not present

## 2019-12-02 DIAGNOSIS — Y998 Other external cause status: Secondary | ICD-10-CM | POA: Insufficient documentation

## 2019-12-02 DIAGNOSIS — F17210 Nicotine dependence, cigarettes, uncomplicated: Secondary | ICD-10-CM | POA: Diagnosis not present

## 2019-12-02 DIAGNOSIS — Z7982 Long term (current) use of aspirin: Secondary | ICD-10-CM | POA: Diagnosis not present

## 2019-12-02 DIAGNOSIS — S79921A Unspecified injury of right thigh, initial encounter: Secondary | ICD-10-CM | POA: Diagnosis present

## 2019-12-02 DIAGNOSIS — W010XXA Fall on same level from slipping, tripping and stumbling without subsequent striking against object, initial encounter: Secondary | ICD-10-CM | POA: Diagnosis not present

## 2019-12-02 MED ORDER — LIDOCAINE 5 % EX PTCH: 1.0000 | MEDICATED_PATCH | Freq: Two times a day (BID) | CUTANEOUS | 0 refills | Status: AC

## 2019-12-02 MED ORDER — LIDOCAINE 5 % EX PTCH
1.0000 | MEDICATED_PATCH | CUTANEOUS | Status: DC
  Administered 2019-12-02: 1 via TRANSDERMAL
  Filled 2019-12-02: qty 1

## 2019-12-02 NOTE — Discharge Instructions (Addendum)
Follow discharge care instructions using heat instead of ice.  Changes Lidoderm patches every 12 hours.

## 2019-12-02 NOTE — ED Triage Notes (Signed)
Pt here for right leg and hip pain.  Last week stepped on cat and fell.  Initially didn't have pain but now c/o pain with walking.  Able to walk but hurts.  Denies any medical problems.

## 2019-12-02 NOTE — ED Notes (Signed)
See triage note  Presents s/p fall 1 week  States she landed on right hip/leg  States she did not have pain at first  Developed pain a few days later  Unable to bear wt

## 2019-12-02 NOTE — ED Provider Notes (Signed)
[facility] Emergency Department Provider Note   ____________________________________________   First MD Initiated Contact with Patient 12/02/19 1141     (approximate)  I have reviewed the triage vital signs and the nursing notes.   HISTORY  Chief Complaint Leg Pain    HPI Haley Bridges is a 84 y.o. female patient complain of right hip and leg pain secondary to a fall 1 week ago.  Patient states she tripped and fell after stepping on a cat.  Patient state increased pain with weightbearing and walking.  Patient rates the pain as a 6/10.  Patient described pain as "achy".  Reports no other complaints from the fall.  No palliative measure for complaint.      Past Medical History:  Diagnosis Date  . Patient denies medical problems     There are no problems to display for this patient.   Past Surgical History:  Procedure Laterality Date  . FACIAL FRACTURE SURGERY    . PARTIAL HYSTERECTOMY      Prior to Admission medications   Medication Sig Start Date End Date Taking? Authorizing Provider  aspirin 325 MG tablet Take 325 mg by mouth daily.   Yes [provider]  docusate sodium (COLACE) 100 MG capsule Take 1 tablet once or twice daily as needed for constipation while taking narcotic pain medicine 05/12/17   Hinda Kehr, MD  lidocaine (LIDODERM) 5 % Place 1 patch onto the skin every 12 (twelve) hours. Remove & Discard patch within 12 hours or as directed by MD 12/02/19 12/01/20  Sable Feil, PA-C    Allergies Patient has no known allergies.  History reviewed. No pertinent family history.  Social History Social History   Tobacco Use  . Smoking status: Current Every Day Smoker    Packs/day: 1.00    Types: Cigarettes  . Smokeless tobacco: Never Used  Substance Use Topics  . Alcohol use: No  . Drug use: No    Review of Systems Constitutional: No fever/chills Eyes: No visual changes. ENT: No sore throat. Cardiovascular:  Denies chest pain. Respiratory: Denies shortness of breath. Gastrointestinal: No abdominal pain.  No nausea, no vomiting.  No diarrhea.  No constipation. Genitourinary: Negative for dysuria. Musculoskeletal: Right lateral hip pain. Skin: Negative for rash. Neurological: Negative for headaches, focal weakness or numbness.   ____________________________________________   PHYSICAL EXAM:  VITAL SIGNS: ED Triage Vitals [12/02/19 1119]  Enc Vitals Group     BP (!) 145/68     Pulse Rate 88     Resp 16     Temp 98.2 F (36.8 C)     Temp Source Oral     SpO2 96 %     Weight 120 lb (54.4 kg)     Height 5\' 3"  (1.6 m)     Head Circumference      Peak Flow      Pain Score 6     Pain Loc      Pain Edu?      Excl. in [place]?     Constitutional: Alert and oriented. Well appearing and in no acute distress. Cardiovascular: Normal rate, regular rhythm. Grossly normal heart sounds.  Good peripheral circulation. Respiratory: Normal respiratory effort.  No retractions. Lungs CTAB. Gastrointestinal: Soft and nontender. No distention. No abdominal bruits. No CVA tenderness. Genitourinary: Deferred Musculoskeletal: No obvious deformity to the right hip.  No leg length discrepancy.  Patient is moderate guarding palpation to greater trochanter.  No lower extremity tenderness nor  edema.  No joint effusions. Neurologic:  Normal speech and language. No gross focal neurologic deficits are appreciated. No gait instability. Skin:  Skin is warm, dry and intact. No rash noted. Psychiatric: Mood and affect are normal. Speech and behavior are normal.  ____________________________________________   LABS (all labs ordered are listed, but only abnormal results are displayed)  Labs Reviewed - No data to display ____________________________________________  EKG   ____________________________________________  RADIOLOGY  ED MD interpretation:    Official radiology report(s): DG Hip Unilat W or Wo  Pelvis 2-3 Views Right  Result Date: 12/02/2019 CLINICAL DATA:  Right hip pain status post fall 1 week ago EXAM: DG HIP (WITH OR WITHOUT PELVIS) 2-3V RIGHT COMPARISON:  None. FINDINGS: There is no evidence of hip fracture or dislocation. There is generalized osteopenia. There is mild osteoarthritis of the right hip. There is no aggressive osseous lesion. IMPRESSION: No acute osseous injury of the right hip. Given the patient's age and osteopenia, if there is persistent clinical concern for an occult hip fracture, a MRI of the hip is recommended for increased sensitivity. Electronically Signed   By: Elige Ko   On: 12/02/2019 12:27    ____________________________________________   PROCEDURES  Procedure(s) performed (including Critical Care):  Procedures   ____________________________________________   INITIAL IMPRESSION / ASSESSMENT AND PLAN / ED COURSE  As part of my medical decision making, I reviewed the following data within the electronic MEDICAL RECORD NUMBER     Patient presents with right hip pain secondary to fall 1 week ago.  Discussed x-ray findings with patient with no acute findings.  Discussed sequela of hip contusion with age group for patient.  Advised to follow-up PCP in 1 week if no improvement or worsening complaint.  Patient given a prescription for Lidoderm patches in lieu of pain medications.    Haley Bridges was evaluated in Emergency Department on 12/02/2019 for the symptoms described in the history of present illness. She was evaluated in the context of the global COVID-19 pandemic, which necessitated consideration that the patient might be at risk for infection with the SARS-CoV-2 virus that causes COVID-19. Institutional protocols and algorithms that pertain to the evaluation of patients at risk for COVID-19 are in a state of rapid change based on information released by regulatory bodies including the CDC and federal and state organizations. These policies and  algorithms were followed during the patient's care in the ED.       ____________________________________________   FINAL CLINICAL IMPRESSION(S) / ED DIAGNOSES  Final diagnoses:  Contusion, hip and thigh, right, initial encounter     ED Discharge Orders         Ordered    lidocaine (LIDODERM) 5 %  Every 12 hours     12/02/19 1237           Note:  This document was prepared using Dragon voice recognition software and may include unintentional dictation errors.    Joni Reining, PA-C 12/02/19 1240    Emily Filbert, MD 12/02/19 [phone]

## 2019-12-02 NOTE — ED Notes (Signed)
To x-ray via stretcher.

## 2021-02-24 IMAGING — CR DG HIP (WITH OR WITHOUT PELVIS) 2-3V*R*
3 series · 3 of 3 positions shown · non-contrast
Comparison: None.

CLINICAL DATA: Right hip pain status post fall 1 week ago

EXAM:
DG HIP (WITH OR WITHOUT PELVIS) 2-3V RIGHT

[pelvis ap]
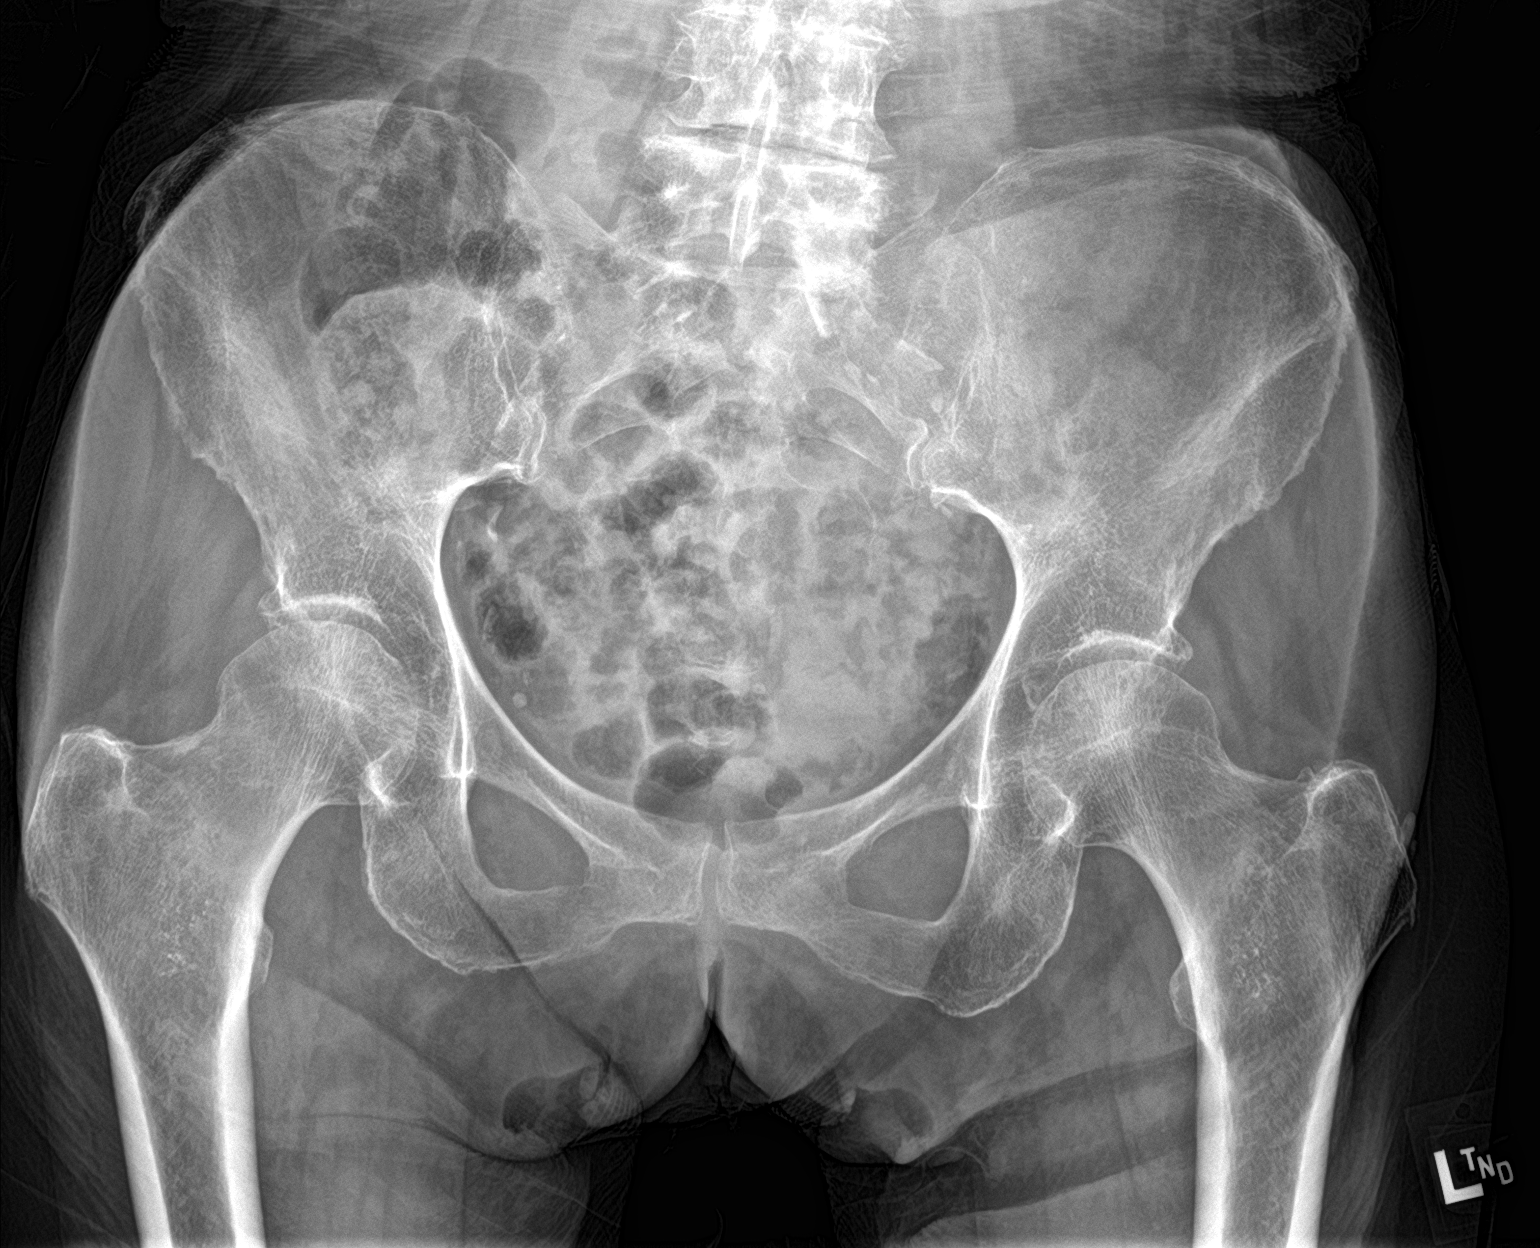

[hip ap]
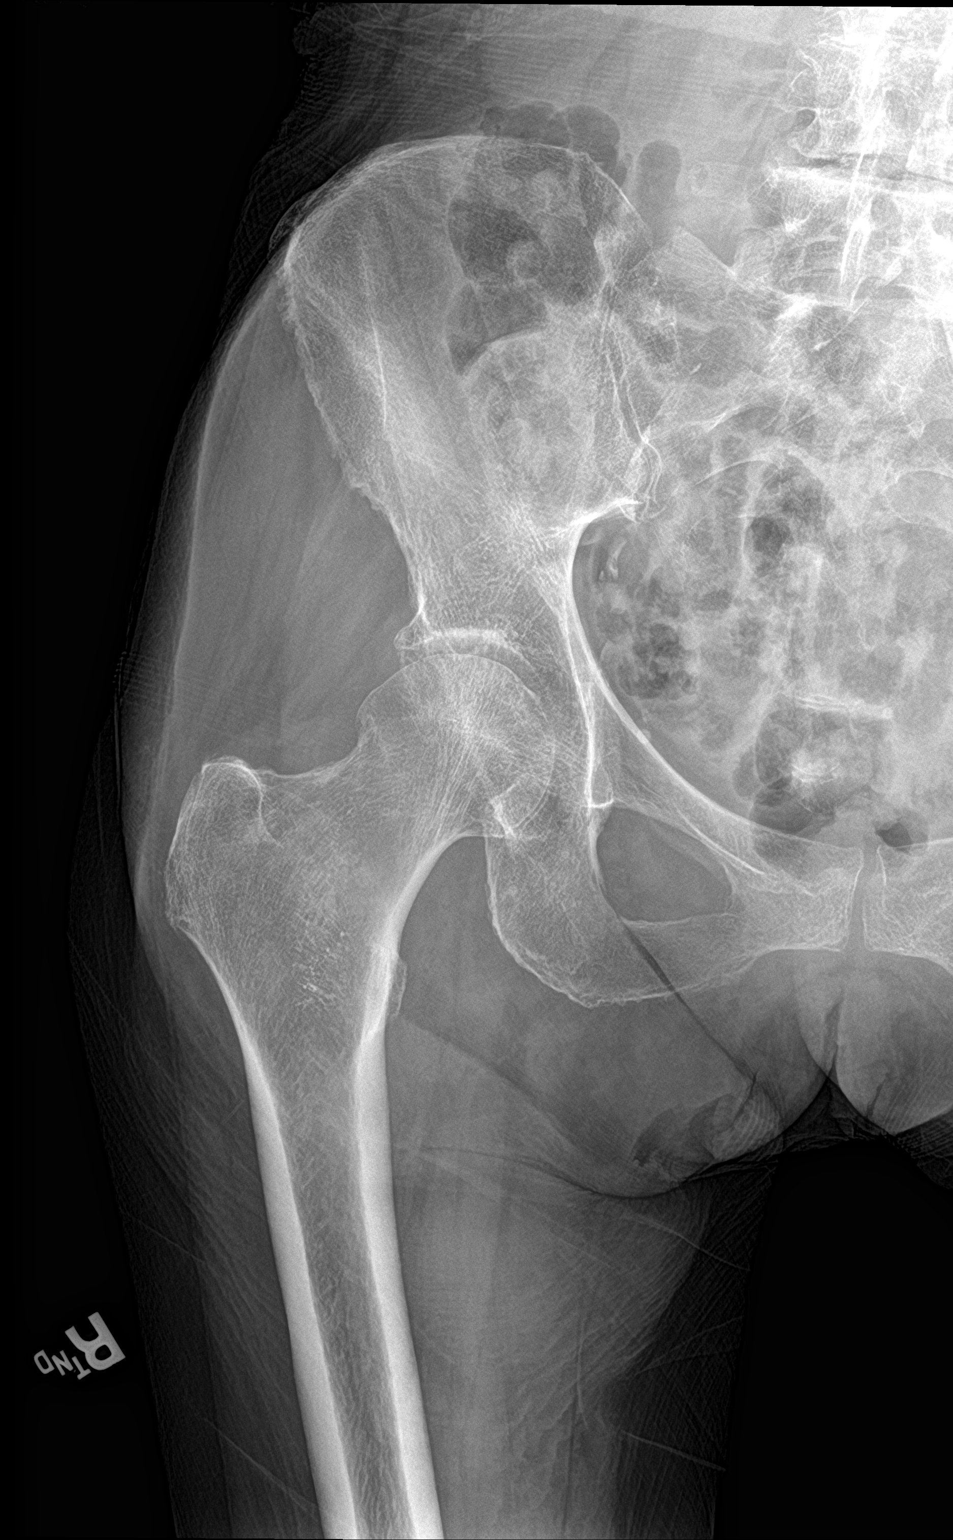

[hip lat]
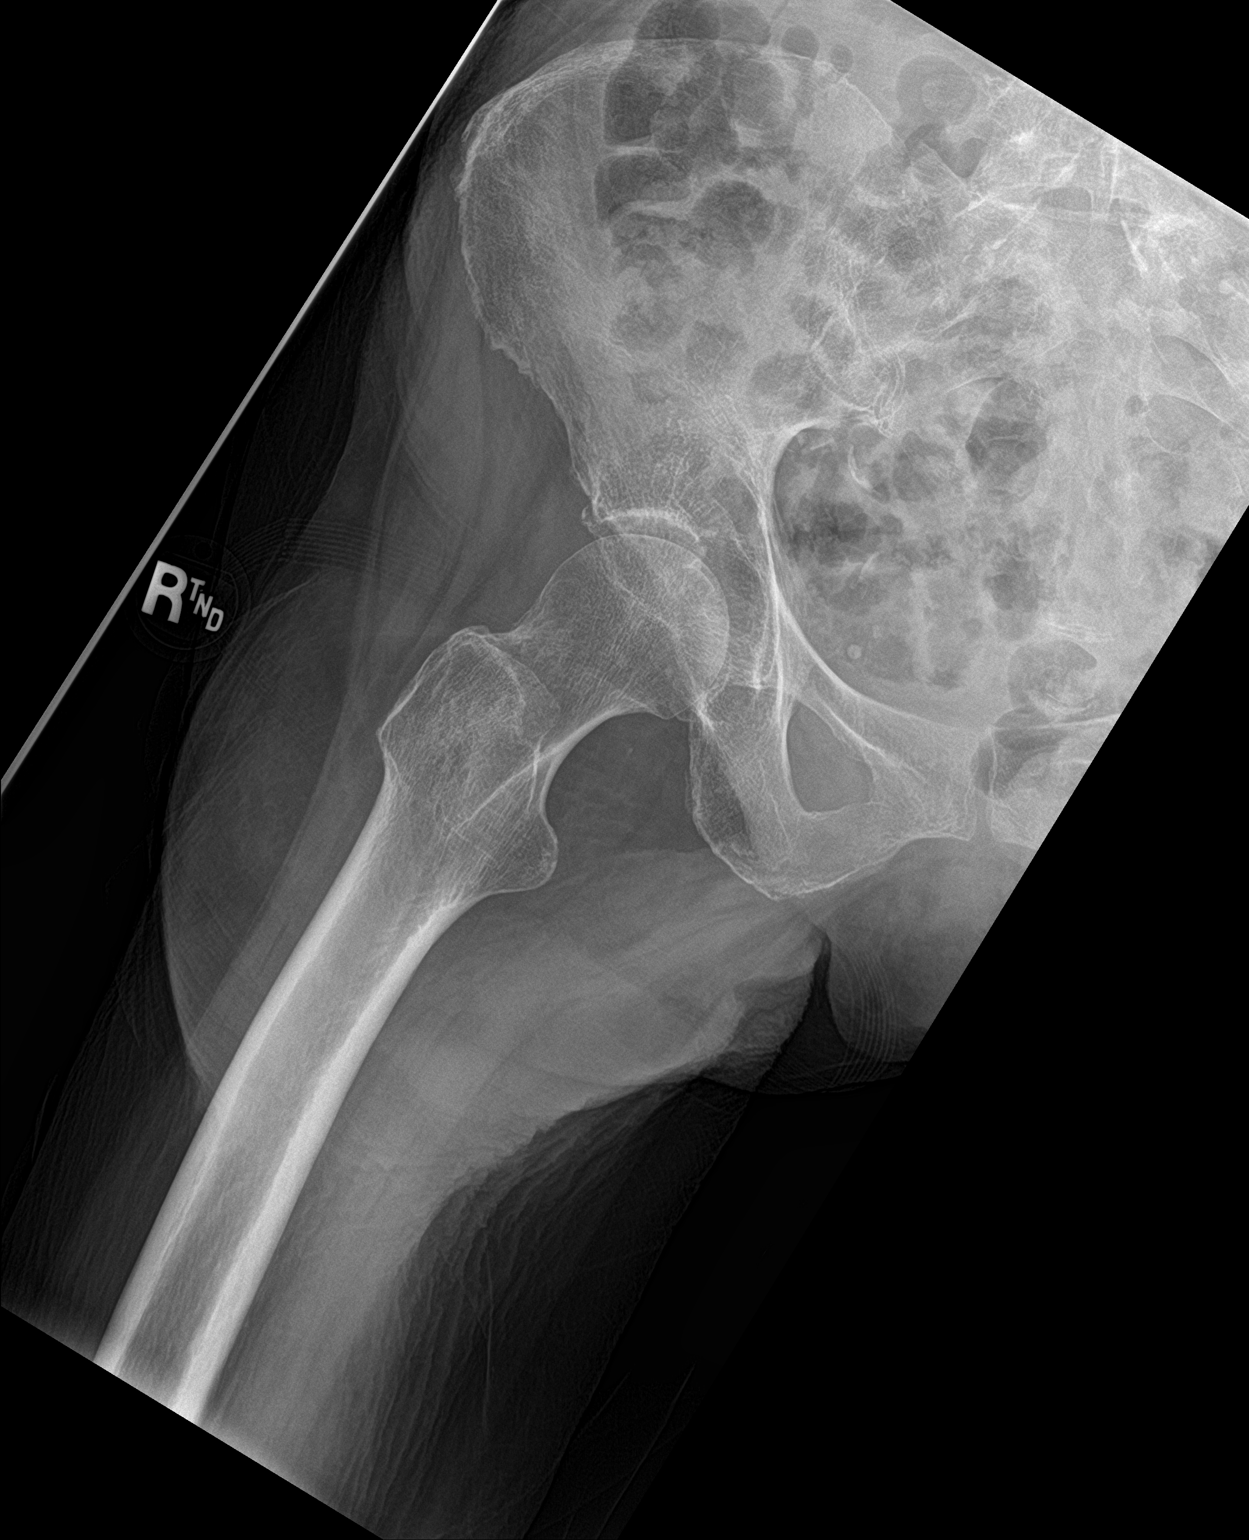

[3 of 3 positions shown; findings below may reference images not displayed]

FINDINGS: There is no evidence of hip fracture or dislocation. There is
generalized osteopenia. There is mild osteoarthritis of the right
hip. There is no aggressive osseous lesion.
IMPRESSION: No acute osseous injury of the right hip. Given the patient's age
and osteopenia, if there is persistent clinical concern for an
occult hip fracture, a MRI of the hip is recommended for increased
sensitivity.

## 2023-04-17 ENCOUNTER — Other Ambulatory Visit: Payer: Self-pay | Admitting: Internal Medicine

## 2023-04-17 DIAGNOSIS — R053 Chronic cough: Secondary | ICD-10-CM

## 2023-04-17 DIAGNOSIS — Z0000 Encounter for general adult medical examination without abnormal findings: Secondary | ICD-10-CM

## 2023-04-17 DIAGNOSIS — R634 Abnormal weight loss: Secondary | ICD-10-CM

## 2023-05-01 ENCOUNTER — Ambulatory Visit
Admission: RE | Admit: 2023-05-01 | Discharge: 2023-05-01 | Disposition: A | Discharge status: Home / Self Care | Payer: Medicare Other | Source: Ambulatory Visit | Attending: Internal Medicine | Admitting: Internal Medicine

## 2023-05-01 DIAGNOSIS — R053 Chronic cough: Secondary | ICD-10-CM | POA: Diagnosis present

## 2023-05-01 DIAGNOSIS — R634 Abnormal weight loss: Secondary | ICD-10-CM | POA: Diagnosis present

## 2023-05-01 DIAGNOSIS — Z0000 Encounter for general adult medical examination without abnormal findings: Secondary | ICD-10-CM | POA: Diagnosis present

## 2023-08-24 ENCOUNTER — Emergency Department
Admission: EM | Admit: 2023-08-24 | Discharge: 2023-08-24 | Disposition: A | Discharge status: Home / Self Care | Payer: Medicare Other | Attending: Emergency Medicine | Admitting: Emergency Medicine

## 2023-08-24 ENCOUNTER — Other Ambulatory Visit: Payer: Self-pay

## 2023-08-24 ENCOUNTER — Emergency Department: Payer: Medicare Other

## 2023-08-24 ENCOUNTER — Encounter: Payer: Self-pay | Admitting: Emergency Medicine

## 2023-08-24 DIAGNOSIS — M545 Low back pain, unspecified: Secondary | ICD-10-CM

## 2023-08-24 DIAGNOSIS — X58XXXA Exposure to other specified factors, initial encounter: Secondary | ICD-10-CM | POA: Diagnosis not present

## 2023-08-24 DIAGNOSIS — S39012A Strain of muscle, fascia and tendon of lower back, initial encounter: Secondary | ICD-10-CM | POA: Insufficient documentation

## 2023-08-24 DIAGNOSIS — M549 Dorsalgia, unspecified: Secondary | ICD-10-CM | POA: Diagnosis present

## 2023-08-24 MED ORDER — ONDANSETRON 4 MG PO TBDP
4.0000 mg | ORAL_TABLET | Freq: Once | ORAL | Status: AC
  Administered 2023-08-24: 4 mg via ORAL
  Filled 2023-08-24: qty 1

## 2023-08-24 MED ORDER — HYDROCODONE-ACETAMINOPHEN 5-325 MG PO TABS: 1.0000 | ORAL_TABLET | ORAL | 0 refills | Status: AC | PRN

## 2023-08-24 MED ORDER — HYDROCODONE-ACETAMINOPHEN 5-325 MG PO TABS
1.0000 | ORAL_TABLET | Freq: Once | ORAL | Status: AC
  Administered 2023-08-24: 1 via ORAL
  Filled 2023-08-24: qty 1

## 2023-08-24 NOTE — Discharge Instructions (Addendum)
Follow-up with your primary care provider if any continued problems.  You may use moist warm towels to your back as needed for discomfort.  A prescription for hydrocodone to take every 8 hours if needed for pain was sent to the pharmacy and Haley Bridges's  drug does deliver.

## 2023-08-24 NOTE — ED Provider Notes (Signed)
[facility] Provider Note    Event Date/Time   First MD Initiated Contact with Patient 08/24/23 [phone]     (approximate)   History   Back Pain   HPI  Haley Bridges is a 88 y.o. female   presents to the ED via ambulance with complaint of 3 days mid back pain.  Patient denies any falls but states that the pain started after vacuuming.  She has not taken any over-the-counter medication to reduce her pain.      Physical Exam   Triage Vital Signs: ED Triage Vitals  Encounter Vitals Group     BP 08/24/23 0856 (!) 168/52     Systolic BP Percentile --      Diastolic BP Percentile --      Pulse Rate 08/24/23 0856 83     Resp 08/24/23 0856 16     Temp 08/24/23 0857 (!) 95.4 F (35.2 C)     Temp Source 08/24/23 0856 Oral     SpO2 08/24/23 0856 96 %     Weight 08/24/23 0856 119 lb 14.9 oz (54.4 kg)     Height --      Head Circumference --      Peak Flow --      Pain Score --      Pain Loc --      Pain Education --      Exclude from Growth Chart --     Most recent vital signs: Vitals:   08/24/23 0856 08/24/23 0857  BP: (!) 168/52   Pulse: 83   Resp: 16   Temp:  (!) 95.4 F (35.2 C)  SpO2: 96%      General: Awake, no distress.  Appears to be very uncomfortable with guarded movement. CV:  Good peripheral perfusion.  Heart regular rate and rhythm. Resp:  Normal effort.  Lungs clear bilaterally. Abd:  No distention.  Soft, nontender. Other:  Point tenderness on palpation of the mid lumbar and lower thoracic spine.  Patient appears to have some scoliosis present.  Patient is able to move all 4 extremities without any difficulty and has a very slow range of motion secondary to increased pain.   ED Results / Procedures / Treatments   Labs (all labs ordered are listed, but only abnormal results are displayed) Labs Reviewed - No data to display    RADIOLOGY Thoracic lumbar spine x-rays were reviewed and interpreted by myself independent of  the radiologist and was noted to have moderate degenerative changes.  No obvious compression fracture. Lumbar spine x-ray images reviewed interpreted by myself independent of the radiologist with moderate degenerative changes but no obvious compression fracture.    PROCEDURES:  Critical Care performed:   Procedures   MEDICATIONS ORDERED IN ED: Medications  HYDROcodone-acetaminophen (NORCO/VICODIN) 5-325 MG per tablet 1 tablet (1 tablet Oral Given 08/24/23 0922)  ondansetron (ZOFRAN-ODT) disintegrating tablet 4 mg (4 mg Oral Given 08/24/23 9604)     IMPRESSION / MDM / ASSESSMENT AND PLAN / ED COURSE  I reviewed the triage vital signs and the nursing notes.   Differential diagnosis includes, but is not limited to, lumbar strain, compression fracture, musculoskeletal pain, acute on chronic back pain.  88 year old female presents to the ED with complaint of back pain for the last 4 days which started after she vacuumed the floor at her house.  She denies any direct trauma or falls.  X-rays show degenerative changes.  Patient was given hydrocodone while in the  emergency department states that this has improved her pain.  She was made aware that this medication could cause drowsiness and therefore the medication being sent to the pharmacy would say every 8 hours as needed.  Patient is encouraged to follow-up with her PCP if any continued problems.      Patient's presentation is most consistent with acute complicated illness / injury requiring diagnostic workup.  FINAL CLINICAL IMPRESSION(S) / ED DIAGNOSES   Final diagnoses:  Strain of lumbar region, initial encounter  Acute midline low back pain without sciatica     Rx / DC Orders   ED Discharge Orders          Ordered    HYDROcodone-acetaminophen (NORCO/VICODIN) 5-325 MG tablet  Every 4 hours PRN        08/24/23 1110             Note:  This document was prepared using Dragon voice recognition software and may include  unintentional dictation errors.   Tommi Rumps, PA-C 08/24/23 1119    Minna Antis, MD 08/24/23 1329

## 2023-08-24 NOTE — ED Triage Notes (Signed)
C/O mid back pain x 3 days. States pain started after vacuuming, feels like a muscle spasm.

## 2023-08-24 NOTE — ED Notes (Signed)
First Nurse Note: Pt to ED via [facility] from home for back pain x 3 days. Pt hypertensive with hx/o same, pt has not taken her medication today.  BP- 196/91

## 2023-08-24 NOTE — ED Notes (Signed)
See triage note  Presents with lower back pain  States developed pian after vacuuming 3 days ago  Denies any fall

## 2023-08-24 NOTE — ED Notes (Signed)
Patient given cab ticket for discharge. Pt placed in lobby and on list for [facility].

## 2024-05-25 ENCOUNTER — Ambulatory Visit (INDEPENDENT_AMBULATORY_CARE_PROVIDER_SITE_OTHER)

## 2024-05-25 DIAGNOSIS — D229 Melanocytic nevi, unspecified: Secondary | ICD-10-CM

## 2024-05-25 DIAGNOSIS — C4490 Unspecified malignant neoplasm of skin, unspecified: Secondary | ICD-10-CM

## 2024-05-25 DIAGNOSIS — Z1283 Encounter for screening for malignant neoplasm of skin: Secondary | ICD-10-CM

## 2024-05-25 DIAGNOSIS — L821 Other seborrheic keratosis: Secondary | ICD-10-CM

## 2024-05-25 DIAGNOSIS — D485 Neoplasm of uncertain behavior of skin: Secondary | ICD-10-CM

## 2024-05-25 DIAGNOSIS — W908XXA Exposure to other nonionizing radiation, initial encounter: Secondary | ICD-10-CM | POA: Diagnosis not present

## 2024-05-25 DIAGNOSIS — L570 Actinic keratosis: Secondary | ICD-10-CM

## 2024-05-25 DIAGNOSIS — L578 Other skin changes due to chronic exposure to nonionizing radiation: Secondary | ICD-10-CM

## 2024-05-25 DIAGNOSIS — L814 Other melanin hyperpigmentation: Secondary | ICD-10-CM

## 2024-05-25 DIAGNOSIS — D492 Neoplasm of unspecified behavior of bone, soft tissue, and skin: Secondary | ICD-10-CM

## 2024-05-25 DIAGNOSIS — D1801 Hemangioma of skin and subcutaneous tissue: Secondary | ICD-10-CM | POA: Diagnosis not present

## 2024-05-25 NOTE — Patient Instructions (Addendum)
 Cryosurgery  Cryosurgery ("freezing") uses liquid nitrogen to destroy certain types of skin lesions. Lowering the temperature of the lesion in a small area surrounding skin destroys the lesion. Immediately following cryosurgery, you will notice redness and swelling of the treatment area. Blistering or weeping may occur, lasting approximately one week which will then be followed by crusting. Most areas will heal completely in 10 to 14 days.  Wash the treated areas daily. Allow soap and water to run over the areas, but do not scrub. Should a scab or crust form, allow it to fall off on its own. Do not remove or pick at it. Application of an ointment  and a bandage may make you feel more comfortable, but it is not necessary. Some people develop an allergy to Neosporin, so we recommend that Vaseline or  Aquaphor be used.  The cryotherapy site will be more sensitive than your surrounding skin. Keep it covered, and remember to apply sunscreen every day to all your sun exposed skin. A scar may remain which is lighter or pinker than your normal skin. Your body will continue to improve your scar for up to one year; however a light-colored scar may remain.  Infection following cryotherapy is rare. However if you are worried about the appearance of the treated area, contact your doctor. We have a physician on call at all times. If you have any concerns about the site, please call our clinic at [phone]    Sunscreen  Who needs sunscreen? Everyone. Sunscreen use can help prevent skin cancer by protecting you from the sun's harmful ultraviolet rays. Anyone can get skin cancer, regardless of age, gender or race. In fact, it is estimated that one in five Americans will develop skin cancer in their lifetime.  Sunscreen alone cannot fully protect you. In addition to wearing sunscreen, dermatologists recommend taking the following steps to protect your skin and find skin cancer early:  Seek shade when appropriate,  remembering that the sun's rays are strongest between 10 a.m. and 2 p.m. If your shadow is shorter than you are, seek shade. Dress to protect yourself from the sun by wearing a lightweight long-sleeved shirt, pants, a wide-brimmed hat and sunglasses, when possible.  Use extra caution near water, snow and sand as they reflect the damaging rays of the sun, which can increase your chance of sunburn.  Get vitamin D safely through a healthy diet that may include vitamin supplements. Don't seek the sun. Avoid tanning beds. Ultraviolet light from the sun and tanning beds can cause skin cancer and wrinkling. If you want to look tan, you may wish to use a self-tanning product, but continue to use sunscreen with it.  When should I use sunscreen? Every day you go outside--even if you're just walking to and from your form of transportation. The sun emits harmful UV rays year-round. Even on cloudy days, up to 80 percent of the sun's harmful UV rays can penetrate your skin. Snow, sand and water increase the need for sunscreen because they reflect the sun's rays.  How much sunscreen should I use, and how often should I apply it? Most people only apply 25-50 percent of the recommended amount of sunscreen. Apply enough sunscreen to cover all exposed skin. Most adults need about 1 ounce -- or enough to fill a shot glass -- to fully cover their body.  Don't forget to apply to the tops of your feet, your neck, your ears and the top of your head. Apply sunscreen to dry  skin 15 minutes before going outdoors.  Skin cancer also can form on the lips. To protect your lips, apply a lip balm or lipstick that contains sunscreen with an SPF of 30 or higher.  When outdoors, reapply sunscreen approximately every two hours, or after swimming or sweating, according to the directions on the bottle.   Broad-spectrum sunscreens protect against both UVA and UVB rays. What is the difference between the rays? Sunlight consists of two types  of harmful rays that reach the earth -- UVA rays and UVB rays. Overexposure to either can lead to skin cancer. In addition to causing skin cancer, here's what each of these rays do:  UVA rays (or aging rays) can prematurely age your skin, causing wrinkles and age spots, and can pass through window glass. UVB rays (or burning rays) are the primary cause of sunburn and are blocked by window glass  There is no safe way to tan. Every time you tan, you damage your skin. As this damage builds, you speed up the aging of your skin and increase your risk for all types of skin cancer.  What is the difference between chemical and physical sunscreens? Chemical sunscreens work like a sponge, absorbing the sun's rays. They contain one or more of the following active ingredients: oxybenzone, avobenzone, octisalate, octocrylene, homosalate and octinoxate. These formulations tend to be easier to rub into the skin without leaving a white residue.   Physical sunscreens work like a shield, sitting sit on the surface of your skin and deflecting the sun's rays. They contain the active ingredients zinc oxide and/or titanium dioxide. Use this sunscreen if you have sensitive skin.   What type of sunscreen should I use? The best type of sunscreen is the one you will use again and again. Just make sure it offers broad-spectrum (UVA and UVB) protection, has an SPF of 30+, and is water-resistant. The kind of sunscreen you use is a matter of personal choice, and may vary depending on the area of the body to be protected. Available sunscreen options include lotions, creams, gels, ointments, wax sticks and sprays.  Recommended physical sunscreens for face: - Neutrogena Sheer Zinc - Aveeno Positively Mineral Sensitive - CeraVe Hydrating Mineral (also has a tinted version) - La Roche-Posay Anthelios Mineral Face (comes as a cream, lotion, light fluid, and there is also a tinted version).  - EltaMD UV Clear (also has a tinted  version)  Recommended physical sunscreens for body: - Neutrogena Sheer Zinc Dry-Touch Sunscreen Sensitive Skin Lotion Broad Spectrum SPF 50 - Aveeno Positively Mineral Sensitive Skin Sunscreen Broad Spectrum SPF 50 - La Roche-Posay Anthelios SPF 50 Mineral Sunscreen - Gentle Lotion - CeraVe Hydrating Mineral Sunscreen SPF 50  Recommended chemical sunscreens for face: - Anthelios UV Correct Face Sunscreen SPF 70 with Niacinamide - Neutrogena Clear Face Oil-Free SPF 50 with Helioplex - Neutrogena Sport Face Oil-Free SPF 70+ with Helioplex - Aveeno Protect + Hydrate Sunscreen For Face SPF 70 - La Roche-Posay Anthelios Light Fluid Sunscreen for Face SPF 60  Recommended chemical sunscreens for body: - Neutrogena Ultra Sheer Dry-Touch Sunscreen SPF 70 - Aveeno Protect + Hydrate Broad Spectrum All-Day Hydration SPF 60 (comes in a big pump) - La Roche-Posay Anthelios Melt-In Milk Sunscreen SPF 60  Melanoma ABCDEs  Melanoma is the most dangerous type of skin cancer, and is the leading cause of death from skin disease.  You are more likely to develop melanoma if you: Have light-colored skin, light-colored eyes, or red or blond  hair Spend a lot of time in the sun Tan regularly, either outdoors or in a tanning bed Have had blistering sunburns, especially during childhood Have a close family member who has had a melanoma Have atypical moles or large birthmarks  Early detection of melanoma is key since treatment is typically straightforward and cure rates are extremely high if we catch it early.   The first sign of melanoma is often a change in a mole or a new dark spot.  The ABCDE system is a way of remembering the signs of melanoma.  A for asymmetry:  The two halves do not match. B for border:  The edges of the growth are irregular. C for color:  A mixture of colors are present instead of an even brown color. D for diameter:  Melanomas are usually (but not always) greater than 6mm - the size  of a pencil eraser. E for evolution:  The spot keeps changing in size, shape, and color.  Please check your skin once per month between visits. You can use a small mirror in front and a large mirror behind you to keep an eye on the back side or your body.   If you see any new or changing lesions before your next follow-up, please call to schedule a visit.  Please continue daily skin protection including broad spectrum sunscreen SPF 30+ to sun-exposed areas, reapplying every 2 hours as needed when you're outdoors.   Staying in the shade or wearing long sleeves, sun glasses (UVA+UVB protection) and wide brim hats (4-inch brim around the entire circumference of the hat) are also recommended for sun protection.   Due to recent changes in healthcare laws, you may see results of your pathology and/or laboratory studies on MyChart before the doctors have had a chance to review them. We understand that in some cases there may be results that are confusing or concerning to you. Please understand that not all results are received at the same time and often the doctors may need to interpret multiple results in order to provide you with the best plan of care or course of treatment. Therefore, we ask that you please give us  2 business days to thoroughly review all your results before contacting the office for clarification. Should we see a critical lab result, you will be contacted sooner.   If You Need Anything After Your Visit  If you have any questions or concerns for your doctor, please call our main line at [phone] and press option 4 to reach your doctor's medical assistant. If no one answers, please leave a voicemail as directed and we will return your call as soon as possible. Messages left after 4 pm will be answered the following business day.   You may also send us  a message via MyChart. We typically respond to MyChart messages within 1-2 business days.  For prescription refills, please ask  your pharmacy to contact our office. Our fax number is [phone].  If you have an urgent issue when the clinic is closed that cannot wait until the next business day, you can page your doctor at the number below.    Please note that while we do our best to be available for urgent issues outside of office hours, we are not available 24/7.   If you have an urgent issue and are unable to reach us , you may choose to seek medical care at your doctor's office, retail clinic, urgent care center, or emergency room.  If you have  a medical emergency, please immediately call 911 or go to the emergency department.  Pager Numbers  - Dr. Hester: [phone]  - Dr. Jackquline: [phone]  - Dr. Claudene: [phone]   In the event of inclement weather, please call our main line at [phone] for an update on the status of any delays or closures.  Dermatology Medication Tips: Please keep the boxes that topical medications come in in order to help keep track of the instructions about where and how to use these. Pharmacies typically print the medication instructions only on the boxes and not directly on the medication tubes.   If your medication is too expensive, please contact our office at [phone] option 4 or send us  a message through MyChart.   We are unable to tell what your co-pay for medications will be in advance as this is different depending on your insurance coverage. However, we may be able to find a substitute medication at lower cost or fill out paperwork to get insurance to cover a needed medication.   If a prior authorization is required to get your medication covered by your insurance company, please allow us  1-2 business days to complete this process.  Drug prices often vary depending on where the prescription is filled and some pharmacies may offer cheaper prices.  The website www.goodrx.com contains coupons for medications through different pharmacies. The prices here do not  account for what the cost may be with help from insurance (it may be cheaper with your insurance), but the website can give you the price if you did not use any insurance.  - You can print the associated coupon and take it with your prescription to the pharmacy.  - You may also stop by our office during regular business hours and pick up a GoodRx coupon card.  - If you need your prescription sent electronically to a different pharmacy, notify our office through [facility] or by phone at [phone] option 4.     Si Usted Necesita Algo Despus de Su Visita  Tambin puede enviarnos un mensaje a travs de Clinical cytogeneticist. Por lo general respondemos a los mensajes de MyChart en el transcurso de 1 a 2 das hbiles.  Para renovar recetas, por favor pida a su farmacia que se ponga en contacto con nuestra oficina. Randi lakes de fax es [place] [phone].  Si tiene un asunto urgente cuando la clnica est cerrada y que no puede esperar hasta el siguiente da hbil, puede llamar/localizar a su doctor(a) al nmero que aparece a continuacin.   Por favor, tenga en cuenta que aunque hacemos todo lo posible para estar disponibles para asuntos urgentes fuera del horario de [place], no estamos disponibles las 24 horas del da, los 7 [address]  [address] de la [place].   Si tiene un problema urgente y no puede comunicarse con nosotros, puede optar por buscar atencin mdica  en el consultorio de su doctor(a), en una clnica privada, en un centro de atencin urgente o en una sala de emergencias.  Si tiene Engineer, drilling, por favor llame inmediatamente al 911 o vaya a la sala de emergencias.  Nmeros de bper  - Dr. Hester: [phone]  - Dra. Jackquline: [phone]  - Dr. Claudene: [phone]   En caso de inclemencias del tiempo, por favor llame a landry capes principal al [phone] para una actualizacin sobre el [place] de cualquier retraso o cierre.  Consejos para la medicacin en dermatologa: Por  favor, guarde las cajas en las que vienen los medicamentos de [place]  tpico para ayudarle a seguir las instrucciones sobre dnde y cmo usarlos. Las farmacias generalmente imprimen las instrucciones del medicamento slo en las cajas y no directamente en los tubos del [place].   Si su medicamento es muy caro, por favor, pngase en contacto con landry rieger llamando al [phone] y presione la opcin 4 o envenos un mensaje a travs de Clinical cytogeneticist.   No podemos decirle cul ser su copago por los medicamentos por adelantado ya que esto es diferente dependiendo de la cobertura de su seguro. Sin embargo, es posible que podamos encontrar un medicamento sustituto a Audiological scientist un formulario para que el seguro cubra el medicamento que se considera necesario.   Si se requiere una autorizacin previa para que su compaa de seguros malta su medicamento, por favor permtanos de 1 a 2 das hbiles para completar este proceso.  Los precios de los medicamentos varan con frecuencia dependiendo del Environmental consultant de dnde se surte la receta y alguna farmacias pueden ofrecer precios ms baratos.  El sitio web www.goodrx.com tiene cupones para medicamentos de Health and safety inspector. Los precios aqu no tienen en cuenta lo que podra costar con la ayuda del seguro (puede ser ms barato con su seguro), pero el sitio web puede darle el precio si no utiliz Tourist information centre manager.  - Puede imprimir el cupn correspondiente y llevarlo con su receta a la farmacia.  - Tambin puede pasar por nuestra oficina durante el horario de atencin regular y Education officer, museum una tarjeta de cupones de GoodRx.  - Si necesita que su receta se enve electrnicamente a una farmacia diferente, informe a nuestra oficina a travs de MyChart de [place] o por telfono llamando al [phone] y presione la opcin 4.

## 2024-05-25 NOTE — Progress Notes (Signed)
 Subjective   Haley Bridges is a 88 y.o. female who presents for the following: Total body skin exam for skin cancer screening and mole check. The patient has spots, moles and lesions to be evaluated, some may be new or changing and the patient may have concern these could be cancer. Patient is new patient.  Today patient reports: Places at right forearm x3 years, right index finger x6 months, right forearm. No hx personal or family hx of skin cancer.   Review of Systems:    No other skin or systemic complaints except as noted in HPI or Assessment and Plan.  The following portions of the chart were reviewed this encounter and updated as appropriate: medications, allergies, medical history  Relevant Medical History:  n/a   Objective  Well appearing patient in no apparent distress; mood and affect are within normal limits. Examination was performed of the: Full Skin Examination: scalp, head, eyes, ears, nose, lips, neck, chest, axillae, abdomen, back, buttocks, bilateral upper extremities, bilateral lower extremities, hands, feet, fingers, toes, fingernails, and toenails.   Examination notable for: Angioma(s): Scattered red vascular papule(s)  , Lentigo/lentigines: Scattered pigmented macules that are tan to brown in color and are somewhat non-uniform in shape and concentrated in the sun-exposed areas, Nevus/nevi: Scattered well-demarcated, regular, pigmented macule(s) and/or papule(s)  , Seborrheic Keratosis(es): Stuck-on appearing keratotic papule(s) on the trunk, none  irritated with redness, crusting, edema, and/or partial avulsion, Actinic Damage/Elastosis: chronic sun damage: dyspigmentation, telangiectasia, and wrinkling, Actinic keratosis: Scaly erythematous macule(s) concentrated on sun exposed areas    - several hyperkeratotic papules on face, upper extremities  - L lateral eyebrow 1cm pink pearly papule  - L infraorbital brown patch  - R index with hyperkeratotic tender plaque   Examination limited by: Undergarments and Patient deferred removal       Face/arms x14 (14) Pink scaly macules  Assessment & Plan   BENIGN SKIN FINDINGS  - Lentigines  - Seborrheic keratoses  - Hemangiomas   - Nevus/Multiple Benign Nevi  - Reassurance provided regarding the benign appearance of lesions noted on exam today; no treatment is indicated in the absence of symptoms/changes. - Reinforced importance of photoprotective strategies including liberal and frequent sunscreen use of a broad-spectrum SPF 30 or greater, use of protective clothing, and sun avoidance for prevention of cutaneous malignancy and photoaging.  Counseled patient on the importance of regular self-skin monitoring as well as routine clinical skin examinations as scheduled.   ACTINIC DAMAGE - Chronic condition, secondary to cumulative UV/sun exposure - Recommend daily broad spectrum sunscreen SPF 30+ to sun-exposed areas, reapply every 2 hours as needed.  - Staying in the shade or wearing long sleeves, sun glasses (UVA+UVB protection) and wide brim hats (4-inch brim around the entire circumference of the hat) are also recommended for sun protection.  - Call for new or changing lesions.  Several lesions c/f NMSC on face, upper extremities  - Extensive discussion with patient regarding lesions c/f SCC, BCC on face and arms, pigmented lesion L infraorbital. We discussed gold standard treatment of biopsy to confirm diagnosis followed by excision vs mohs vs ED&C depending on bx report and location. Attempted to biopsy lesion of R index finger c/f SCC but patient could not tolerate injection  - Discussed could do more palliative treatment options including cryotherapy, efudex. Discussed risk of growth/spread  - Opts to tx most bothersome lesions w/ cryotherapy today. Will reconsider biopsy at future visit.   Level of service outlined above  Procedures, orders, diagnosis for this visit:  AK (ACTINIC KERATOSIS)  (14) Face/arms x14 (14) Actinic keratoses are precancerous spots that appear secondary to cumulative UV radiation exposure/sun exposure over time. They are chronic with expected duration over 1 year. A portion of actinic keratoses will progress to squamous cell carcinoma of the skin. It is not possible to reliably predict which spots will progress to skin cancer and so treatment is recommended to prevent development of skin cancer.  Recommend daily broad spectrum sunscreen SPF 30+ to sun-exposed areas, reapply every 2 hours as needed.  Recommend staying in the shade or wearing long sleeves, sun glasses (UVA+UVB protection) and wide brim hats (4-inch brim around the entire circumference of the hat). Call for new or changing lesions. Destruction of lesion - Face/arms x14 (14) Complexity: simple   Destruction method: cryotherapy   Informed consent: discussed and consent obtained   Timeout:  patient name, date of birth, surgical site, and procedure verified Lesion destroyed using liquid nitrogen: Yes   Region frozen until ice ball extended beyond lesion: Yes   Cryo cycles: 1 or 2. Outcome: patient tolerated procedure well with no complications   Post-procedure details: wound care instructions given   Additional details:  Prior to procedure, discussed risks of blister formation, small wound, skin dyspigmentation, or rare scar following cryotherapy. Recommend Vaseline ointment to treated areas while healing.   SKIN EXAM FOR MALIGNANT NEOPLASM   LENTIGO   SEBORRHEIC KERATOSIS   CHERRY ANGIOMA   MULTIPLE BENIGN NEVI   NON-MELANOMA SKIN CANCER   ACTINIC SKIN DAMAGE   ACTINIC KERATOSIS   NEOPLASM OF UNCERTAIN BEHAVIOR OF SKIN    AK (actinic keratosis) -     Destruction of lesion  Skin exam for malignant neoplasm  Lentigo  Seborrheic keratosis  Cherry angioma  Multiple benign nevi  Non-melanoma skin cancer  Actinic skin damage  Actinic keratosis  Neoplasm of  uncertain behavior of skin    Return to clinic: Return for 3-6 months , AK follow-up, w/ Dr. Raymund.  I, Jacquelynn V. Wilfred, CMA, am acting as scribe for Lauraine JAYSON Raymund, MD.  Documentation: I have reviewed the above documentation for accuracy and completeness, and I agree with the above.  Lauraine JAYSON Raymund, MD

## 2024-08-29 ENCOUNTER — Ambulatory Visit
